# Patient Record
Sex: Female | Born: 1980 | Race: White | Hispanic: No | Marital: Married | Smoking: Never smoker
Health system: Southern US, Community
[De-identification: ages and names within clinical notes are randomized; demographics above are authoritative.]

## PROBLEM LIST (undated history)

## (undated) DIAGNOSIS — C801 Malignant (primary) neoplasm, unspecified: Secondary | ICD-10-CM

## (undated) DIAGNOSIS — T7840XA Allergy, unspecified, initial encounter: Secondary | ICD-10-CM

## (undated) DIAGNOSIS — M810 Age-related osteoporosis without current pathological fracture: Secondary | ICD-10-CM

## (undated) DIAGNOSIS — F419 Anxiety disorder, unspecified: Secondary | ICD-10-CM

## (undated) DIAGNOSIS — I10 Essential (primary) hypertension: Secondary | ICD-10-CM

## (undated) DIAGNOSIS — F32A Depression, unspecified: Secondary | ICD-10-CM

## (undated) HISTORY — DX: Essential (primary) hypertension: I10

## (undated) HISTORY — DX: Anxiety disorder, unspecified: F41.9

## (undated) HISTORY — DX: Age-related osteoporosis without current pathological fracture: M81.0

## (undated) HISTORY — DX: Malignant (primary) neoplasm, unspecified: C80.1

## (undated) HISTORY — DX: Allergy, unspecified, initial encounter: T78.40XA

## (undated) HISTORY — PX: EYE SURGERY: SHX253

## (undated) HISTORY — DX: Depression, unspecified: F32.A

## (undated) HISTORY — PX: ABDOMINAL HYSTERECTOMY: SHX81

## (undated) HISTORY — PX: BREAST SURGERY: SHX581

---

## 2012-07-20 DIAGNOSIS — J309 Allergic rhinitis, unspecified: Secondary | ICD-10-CM | POA: Insufficient documentation

## 2012-07-20 DIAGNOSIS — I10 Essential (primary) hypertension: Secondary | ICD-10-CM | POA: Insufficient documentation

## 2012-07-20 DIAGNOSIS — F329 Major depressive disorder, single episode, unspecified: Secondary | ICD-10-CM | POA: Insufficient documentation

## 2013-07-11 DIAGNOSIS — Z87898 Personal history of other specified conditions: Secondary | ICD-10-CM | POA: Insufficient documentation

## 2018-07-29 DIAGNOSIS — R55 Syncope and collapse: Secondary | ICD-10-CM | POA: Insufficient documentation

## 2018-07-29 DIAGNOSIS — R002 Palpitations: Secondary | ICD-10-CM | POA: Insufficient documentation

## 2018-07-29 HISTORY — DX: Syncope and collapse: R55

## 2020-08-21 ENCOUNTER — Ambulatory Visit: Payer: BC Managed Care – PPO | Admitting: Adult Health

## 2020-08-21 ENCOUNTER — Other Ambulatory Visit: Payer: Self-pay

## 2020-08-21 ENCOUNTER — Encounter: Payer: Self-pay | Admitting: Adult Health

## 2020-08-21 VITALS — BP 130/87 | HR 91 | Temp 98.6°F | Resp 16 | Ht 63.0 in | Wt 146.2 lb

## 2020-08-21 DIAGNOSIS — Z9221 Personal history of antineoplastic chemotherapy: Secondary | ICD-10-CM | POA: Insufficient documentation

## 2020-08-21 DIAGNOSIS — Z0000 Encounter for general adult medical examination without abnormal findings: Secondary | ICD-10-CM

## 2020-08-21 DIAGNOSIS — R1310 Dysphagia, unspecified: Secondary | ICD-10-CM | POA: Diagnosis not present

## 2020-08-21 DIAGNOSIS — Z923 Personal history of irradiation: Secondary | ICD-10-CM

## 2020-08-21 DIAGNOSIS — Z90710 Acquired absence of both cervix and uterus: Secondary | ICD-10-CM | POA: Diagnosis not present

## 2020-08-21 DIAGNOSIS — Z9013 Acquired absence of bilateral breasts and nipples: Secondary | ICD-10-CM | POA: Insufficient documentation

## 2020-08-21 DIAGNOSIS — E559 Vitamin D deficiency, unspecified: Secondary | ICD-10-CM | POA: Diagnosis not present

## 2020-08-21 DIAGNOSIS — Z23 Encounter for immunization: Secondary | ICD-10-CM

## 2020-08-21 DIAGNOSIS — M8580 Other specified disorders of bone density and structure, unspecified site: Secondary | ICD-10-CM

## 2020-08-21 DIAGNOSIS — Z853 Personal history of malignant neoplasm of breast: Secondary | ICD-10-CM | POA: Insufficient documentation

## 2020-08-21 DIAGNOSIS — E663 Overweight: Secondary | ICD-10-CM

## 2020-08-21 MED ORDER — LISINOPRIL 10 MG PO TABS: 10.0000 mg | ORAL_TABLET | Freq: Every day | ORAL | 1 refills | Status: DC

## 2020-08-21 MED ORDER — SERTRALINE HCL 100 MG PO TABS: 100.0000 mg | ORAL_TABLET | Freq: Every day | ORAL | 1 refills | Status: DC

## 2020-08-21 NOTE — Patient Instructions (Signed)
Dysphagia Eating Plan, Bite Size Food This diet plan is for people with moderate swallowing problems who have transitioned from pureed and minced foods. Bite size foods are soft and cut into small chunks so that they can be swallowed safely. On this eating plan, you may be instructed to drink liquids that are thickened. Work with your health care provider and your diet and nutrition specialist (dietitian) to make sure that you are following the diet safely and getting all the nutrients you need. What are tips for following this plan? General guidelines for foods   You may eat foods that are tender, soft, and moist.  Always test food texture before taking a bite. Poke food with a fork or spoon to make sure it is tender.  Food should be easy to cut and shew. Avoid large pieces of food that require a lot of chewing.  Take small bites. Each bite should be smaller than your thumb nail (about 20m by 15 mm).  If you were on pureed and minced food diet plans, you may eat any of the foods included in those diets.  Avoid foods that are very dry, hard, sticky, chewy, coarse, or crunchy.  If instructed by your health care provider, thicken liquids. Follow your health care provider's instructions for what products to use, how to do this, and to what thickness. ? Your health care provider may recommend using a commercial thickener, rice cereal, or potato flakes. Ask your health care provider to recommend thickeners. ? Thickened liquids are usually a "pudding-like" consistency, or they may be as thick as honey or thick enough to eat with a spoon. Cooking  To moisten foods, you may add liquids while you are blending, mashing, or grinding your foods to the right consistency. These liquids include gravies, sauces, vegetable or fruit juice, milk, half and half, or water.  Strain extra liquid from foods before eating.  Reheat foods slowly to prevent a tough crust from forming.  Prepare foods in  advance. Meal planning  Eat a variety of foods to get all the nutrients you need.  Some foods may be tolerated better than others. Work with your dietitian to identify which foods are safest for you to eat.  Follow your meal plan as told by your dietitian. What foods are allowed? Grains Moist breads without nuts or seeds. Biscuits, muffins, pancakes, and waffles that are well-moistened with syrup, jelly, margarine, or butter. Cooked cereals. Moist bread stuffing. Moist rice. Well-moistened cold cereal with small chunks. Well-cooked pasta, noodles, rice, and bread dressing in small pieces and thick sauce. Soft dumplings or spaetzle in small pieces and butter or gravy. Vegetables Soft, well-cooked vegetables in small pieces. Soft-cooked, mashed potatoes. Thickened vegetable juice. Fruits Canned or cooked fruits that are soft or moist and do not have skin or seeds. Fresh, soft bananas. Thickened fruit juices. Meat and other protein foods Tender, moist meats or poultry in small pieces. Moist meatballs or meatloaf. Fish without bones. Eggs or egg substitutes in small pieces. Tofu. Tempeh and meat alternatives in small pieces. Well-cooked, tender beans, peas, baked beans, and other legumes. Dairy Thickened milk. Cream cheese. Yogurt. Cottage cheese. Sour cream. Small pieces of soft cheese. Fats and oils Butter. Oils. Margarine. Mayonnaise. Gravy. Spreads. Sweets and desserts Soft, smooth, moist desserts. Pudding. Custard. Moist cakes. Jam. Jelly. Honey. Preserves. Ask your health care provider whether you can have frozen desserts. Seasoning and other foods All seasonings and sweeteners. All sauces with small chunks. Prepared tuna, egg, or chicken  salad without raw fruits or vegetables. Moist casseroles with small, tender pieces of meat. Soups with tender meat. What foods are not allowed? Grains Coarse or dry cereals. Dry breads. Toast. Crackers. Tough, crusty breads, such as [place] bread and  baguettes. Dry pancakes, waffles, and muffins. Sticky rice. Dry bread stuffing. Granola. Popcorn. Chips. Vegetables All raw vegetables. Cooked corn. Rubbery or stiff cooked vegetables. Stringy vegetables, such as celery. Tough, crisp fried potatoes. Potato skins. Fruits Hard, crunchy, stringy, high-pulp, and juicy raw fruits such as apples, pineapple, papaya, and watermelon. Small, round fruits, such as grapes. Dried fruit and fruit leather. Meat and other protein foods Large pieces of meat. Dry, tough meats, such as bacon, sausage, and hot dogs. Chicken, [place], or fish with skin and bones. Crunchy peanut butter. Nuts. Seeds. Nut and seed butters. Dairy Yogurt with nuts, seeds, or large chunks. Large chunks of cheese. Frozen desserts and milk consistency not allowed by your dietitian. Sweets and desserts Dry cakes. Chewy or dry cookies. Any desserts with nuts, seeds, dry fruits, coconut, pineapple, or anything dry, sticky, or hard. Chewy caramel. Licorice. Taffy-type candies. Ask your health care provider whether you can have frozen desserts. Seasoning and other foods Soups with tough or large chunks of meats, poultry, or vegetables. Corn or clam chowder. Smoothies with large chunks of fruit. Summary  Bite size foods can be helpful for people with moderate swallowing problems.  On the dysphagia eating plan, you may eat foods that are soft, moist, and cut into pieces smaller than 51mm by 41mm.  You may be instructed to thicken liquids. Follow your health care provider's instructions about how to do this and to what consistency. This information is not intended to replace advice given to you by your health care provider. Make sure you discuss any questions you have with your health care provider. Document Revised: 01/06/2019 Document Reviewed: 12/26/2016 Elsevier Patient Education  [place]. Dysphagia  Dysphagia is trouble swallowing. This condition occurs when solids and liquids  stick in a person's throat on the way down to the stomach, or when food takes longer to get to the stomach than usual. You may have problems swallowing food, liquids, or both. You may also have pain while trying to swallow. It may take you more time and effort to swallow something. What are the causes? This condition may be caused by:  Muscle problems. They may make it difficult for you to move food and liquids through the esophagus, which is the tube that connects your mouth to your stomach.  Blockages. You may have ulcers, scar tissue, or inflammation that blocks the normal passage of food and liquids. Causes of these problems include: ? Acid reflux from your stomach into your esophagus (gastroesophageal reflux). ? Infections. ? Radiation treatment for cancer. ? Medicines taken without enough fluids to wash them down into your stomach.  Stroke. This can affect the nerves and make it difficult to swallow.  Nerve problems. These prevent signals from being sent to the muscles of your esophagus to squeeze (contract) and move what you swallow down to your stomach.  Globus pharyngeus. This is a common problem that involves a feeling like something is stuck in your throat or a sense of trouble with swallowing, even though nothing is wrong with the swallowing passages.  Certain conditions, such as cerebral palsy or Parkinson's disease. What are the signs or symptoms? Common symptoms of this condition include:  A feeling that solids or liquids are stuck in your throat on  the way down to the stomach.  Pain while swallowing.  Coughing or gagging while trying to swallow. Other symptoms include:  Food moving back from your stomach to your mouth (regurgitation).  Noises coming from your throat.  Chest discomfort with swallowing.  A feeling of fullness when swallowing.  Drooling, especially when the throat is blocked.  Heartburn. How is this diagnosed? This condition may be diagnosed  by:  Barium X-ray. In this test, you will swallow a white liquid that sticks to the inside of your esophagus. X-ray images are then taken.  Endoscopy. In this test, a flexible telescope is inserted down your throat to look at your esophagus and your stomach.  CT scans and an MRI. How is this treated? Treatment for dysphagia depends on the cause of this condition, such as:  If the dysphagia is caused by acid reflux or infection, medicines may be used. They may include antibiotics and heartburn medicines.  If the dysphagia is caused by problems with the muscles, swallowing therapy may be used to help you strengthen your swallowing muscles. You may have to do specific exercises to strengthen the muscles or stretch them.  If the dysphagia is caused by a blockage or mass, procedures to remove the blockage may be done. You may need surgery and a feeding tube. You may need to make diet changes. Ask your health care provider for specific instructions. Follow these instructions at home: Medicines  Take over-the-counter and prescription medicines only as told by your health care provider.  If you were prescribed an antibiotic medicine, take it as told by your health care provider. Do not stop taking the antibiotic even if you start to feel better. Eating and drinking   Follow any diet changes as told by your health care provider.  Work with a diet and nutrition specialist (dietitian) to create an eating plan that will help you get the nutrients you need in order to stay healthy.  Eat soft foods that are easier to swallow.  Cut your food into small pieces and eat slowly. Take small bites.  Eat and drink only when you are sitting upright.  Do not drink alcohol or caffeine. If you need help quitting, ask your health care provider. General instructions  Check your weight every day to make sure you are not losing weight.  Do not use any products that contain nicotine or tobacco, such as  cigarettes, e-cigarettes, and chewing tobacco. If you need help quitting, ask your health care provider.  Keep all follow-up visits as told by your health care provider. This is important. Contact a health care provider if you:  Lose weight because you cannot swallow.  Cough when you drink liquids.  Cough up partially digested food. Get help right away if you:  Cannot swallow your saliva.  Have shortness of breath, a fever, or both.  Have a hoarse voice and also have trouble swallowing. Summary  Dysphagia is trouble swallowing. This condition occurs when solids and liquids stick in a person's throat on the way down to the stomach. You may cough or gag while trying to swallow.  Dysphagia has many possible causes.  Treatment for dysphagia depends on the cause of the condition.  Keep all follow-up visits as told by your health care provider. This is important. This information is not intended to replace advice given to you by your health care provider. Make sure you discuss any questions you have with your health care provider. Document Revised: 02/09/2019 Document Reviewed: 02/09/2019  Elsevier Patient Education  2020 [place] Maintenance, Female Adopting a healthy lifestyle and getting preventive care are important in promoting health and wellness. Ask your health care provider about:  The right schedule for you to have regular tests and exams.  Things you can do on your own to prevent diseases and keep yourself healthy. What should I know about diet, weight, and exercise? Eat a healthy diet   Eat a diet that includes plenty of vegetables, fruits, low-fat dairy products, and lean protein.  Do not eat a lot of foods that are high in solid fats, added sugars, or sodium. Maintain a healthy weight Body mass index (BMI) is used to identify weight problems. It estimates body fat based on height and weight. Your health care provider can help determine your BMI and help  you achieve or maintain a healthy weight. Get regular exercise Get regular exercise. This is one of the most important things you can do for your health. Most adults should:  Exercise for at least 150 minutes each week. The exercise should increase your heart rate and make you sweat (moderate-intensity exercise).  Do strengthening exercises at least twice a week. This is in addition to the moderate-intensity exercise.  Spend less time sitting. Even light physical activity can be beneficial. Watch cholesterol and blood lipids Have your blood tested for lipids and cholesterol at 41 years of age, then have this test every 5 years. Have your cholesterol levels checked more often if:  Your lipid or cholesterol levels are high.  You are older than 41 years of age.  You are at high risk for heart disease. What should I know about cancer screening? Depending on your health history and family history, you may need to have cancer screening at various ages. This may include screening for:  Breast cancer.  Cervical cancer.  Colorectal cancer.  Skin cancer.  Lung cancer. What should I know about heart disease, diabetes, and high blood pressure? Blood pressure and heart disease  High blood pressure causes heart disease and increases the risk of stroke. This is more likely to develop in people who have high blood pressure readings, are of African descent, or are overweight.  Have your blood pressure checked: ? Every 3-5 years if you are 60-88 years of age. ? Every year if you are 34 years old or older. Diabetes Have regular diabetes screenings. This checks your fasting blood sugar level. Have the screening done:  Once every three years after age 39 if you are at a normal weight and have a low risk for diabetes.  More often and at a younger age if you are overweight or have a high risk for diabetes. What should I know about preventing infection? Hepatitis B If you have a higher risk for  hepatitis B, you should be screened for this virus. Talk with your health care provider to find out if you are at risk for hepatitis B infection. Hepatitis C Testing is recommended for:  Everyone born from [birth year] through 1967.  Anyone with known risk factors for hepatitis C. Sexually transmitted infections (STIs)  Get screened for STIs, including gonorrhea and chlamydia, if: ? You are sexually active and are younger than 41 years of age. ? You are older than 41 years of age and your health care provider tells you that you are at risk for this type of infection. ? Your sexual activity has changed since you were last screened, and you are at increased risk for chlamydia  or gonorrhea. Ask your health care provider if you are at risk.  Ask your health care provider about whether you are at high risk for HIV. Your health care provider may recommend a prescription medicine to help prevent HIV infection. If you choose to take medicine to prevent HIV, you should first get tested for HIV. You should then be tested every 3 months for as long as you are taking the medicine. Pregnancy  If you are about to stop having your period (premenopausal) and you may become pregnant, seek counseling before you get pregnant.  Take 400 to 800 micrograms (mcg) of folic acid every day if you become pregnant.  Ask for birth control (contraception) if you want to prevent pregnancy. Osteoporosis and menopause Osteoporosis is a disease in which the bones lose minerals and strength with aging. This can result in bone fractures. If you are 101 years old or older, or if you are at risk for osteoporosis and fractures, ask your health care provider if you should:  Be screened for bone loss.  Take a calcium or vitamin D supplement to lower your risk of fractures.  Be given hormone replacement therapy (HRT) to treat symptoms of menopause. Follow these instructions at home: Lifestyle  Do not use any products that contain  nicotine or tobacco, such as cigarettes, e-cigarettes, and chewing tobacco. If you need help quitting, ask your health care provider.  Do not use street drugs.  Do not share needles.  Ask your health care provider for help if you need support or information about quitting drugs. Alcohol use  Do not drink alcohol if: ? Your health care provider tells you not to drink. ? You are pregnant, may be pregnant, or are planning to become pregnant.  If you drink alcohol: ? Limit how much you use to 0-1 drink a day. ? Limit intake if you are breastfeeding.  Be aware of how much alcohol is in your drink. In the [place] one drink equals one 12 oz bottle of beer (355 mL), one 5 oz glass of wine (148 mL), or one 1 oz glass of hard liquor (44 mL). General instructions  Schedule regular health, dental, and eye exams.  Stay current with your vaccines.  Tell your health care provider if: ? You often feel depressed. ? You have ever been abused or do not feel safe at home. Summary  Adopting a healthy lifestyle and getting preventive care are important in promoting health and wellness.  Follow your health care provider's instructions about healthy diet, exercising, and getting tested or screened for diseases.  Follow your health care provider's instructions on monitoring your cholesterol and blood pressure. This information is not intended to replace advice given to you by your health care provider. Make sure you discuss any questions you have with your health care provider. Document Revised: 09/08/2018 Document Reviewed: 09/08/2018 Elsevier Patient Education  2020 [employer].

## 2020-08-21 NOTE — Progress Notes (Signed)
New patient visit   Patient: Amy Johnston   DOB: 1981/04/15   39 y.o. Female  MRN: [id number] Visit Date: 08/21/2020  Today's healthcare provider: Marcille Buffy, FNP   Chief Complaint  Patient presents with   New Patient (Initial Visit)   Subjective    Amy Johnston is a 39 y.o. female who presents today as a new patient to establish care. She recently moved from [place].  HPI   She is looking for referral to gynecologist over due for PAP/ vaginal  smear. History of abnormal once in past.   She wants to see a gastroenterologist, she has dysphagia, she has a " hernia " and was told to see a gastroenterologist. She denies any choking or worsening symptoms, states she wants to see Gastric doctor after the new year. Denies any bleeding.   No LMP recorded. Patient has had a hysterectomy. Full hysterectomy - 2017.  Breast cancer bilateral mastectomy,2017 had chemo and radiation due to breast cancer.  No eveidence of disease her last check she reports and as a new patient ,she has appointment with breast cancer in [place].   Needs lisinopril and Sertraline refilled, she has been on for a long time she reports. Denies any concerns.  Denies any suicidal or homicidal ideations or intents.   She had seen ENT and they told her all was ok and to see gastroenterologist.   Patient  denies any fever, body aches,chills, rash, chest pain, shortness of breath, nausea, vomiting, or diarrhea.  Denies dizziness, lightheadedness, pre syncopal or syncopal episodes.   Past Medical History:  Diagnosis Date   Allergy    Anxiety    Cancer ([place])    Depression    Hypertension    Osteoporosis    Past Surgical History:  Procedure Laterality Date   ABDOMINAL HYSTERECTOMY     BREAST SURGERY     EYE SURGERY     Family Status  Relation Name Status   Mother  Alive   Father  Alive   Brother  Alive   Daughter  Alive   Daughter  Alive   Family History  Problem Relation Age of Onset     Healthy Mother    Healthy Father    Healthy Brother    Celiac disease Daughter    Social History   Socioeconomic History   Marital status: Married    Spouse name: Not on file   Number of children: Not on file   Years of education: Not on file   Highest education level: Not on file  Occupational History   Not on file  Tobacco Use   Smoking status: Never Smoker   Smokeless tobacco: Never Used  Scientific laboratory technician Use: Never used  Substance and Sexual Activity   Alcohol use: Never   Drug use: Never   Sexual activity: Not on file  Other Topics Concern   Not on file  Social History Narrative   Not on file   Social Determinants of Health   Financial Resource Strain:    Difficulty of Paying Living Expenses: Not on file  Food Insecurity:    Worried About Charity fundraiser in the Last Year: Not on file   [employer] of Food in the Last Year: Not on file  Transportation Needs:    Lack of Transportation (Medical): Not on file   Lack of Transportation (Non-Medical): Not on file  Physical Activity:    Days of Exercise per Week: Not on  file   Minutes of Exercise per Session: Not on file  Stress:    Feeling of Stress : Not on file  Social Connections:    Frequency of Communication with Friends and Family: Not on file   Frequency of Social Gatherings with Friends and Family: Not on file   Attends Religious Services: Not on file   Active Member of Clubs or Organizations: Not on file   Attends Archivist Meetings: Not on file   Marital Status: Not on file   Outpatient Medications Prior to Visit  Medication Sig   letrozole ([place]) 2.5 MG tablet Take by mouth.   [DISCONTINUED] lisinopril (ZESTRIL) 10 MG tablet Take by mouth.   [DISCONTINUED] sertraline (ZOLOFT) 50 MG tablet sertraline 50 mg tablet   No facility-administered medications prior to visit.   Allergies  Allergen Reactions   Codeine     N/V N/V N/V N/V      Immunization History  Administered Date(s) Administered   Influenza Inj Mdck Quad Pf 08/14/2020   Influenza Split 07/16/2012   Influenza,inj,quad, With Preservative 06/10/2018   Moderna SARS-COVID-2 Vaccination 08/16/2020   PFIZER SARS-COV-2 Vaccination 12/04/2019, 12/25/2019    Health Maintenance  Topic Date Due   PAP SMEAR-Modifier  08/21/2020 (Originally 12/19/1999)   TETANUS/TDAP  09/26/2020 (Originally 12/18/1997)   Hepatitis C Screening  08/21/2021 (Originally [birth year])   HIV Screening  08/21/2021 (Originally 12/18/1993)   INFLUENZA VACCINE  Completed   COVID-19 Vaccine  Completed    Patient Care Team: Chelise Hanger, Kelby Aline, FNP as PCP - General (Family Medicine)  Review of Systems  Constitutional: Negative.   HENT: Negative.   Eyes: Negative.   Respiratory: Negative.   Cardiovascular: Negative.   Gastrointestinal: Negative.   Endocrine: Negative.   Genitourinary: Negative.   Musculoskeletal: Negative.   Skin: Negative.   Allergic/Immunologic: Negative.   Neurological: Negative.   Hematological: Negative.   Psychiatric/Behavioral: Negative.     Last CBC No results found for: WBC, HGB, HCT, MCV, MCH, RDW, PLT Last metabolic panel No results found for: GLUCOSE, NA, K, CL, CO2, BUN, CREATININE, GFRNONAA, GFRAA, CALCIUM, PHOS, PROT, ALBUMIN, LABGLOB, AGRATIO, BILITOT, ALKPHOS, AST, ALT, ANIONGAP    Objective    Temp 98.6 F (37 C) (Oral)    Resp 16    Ht 5\' 3"  (1.6 m)    Wt 146 lb 3.2 oz (66.3 kg)    BMI 25.90 kg/m  Physical Exam Vitals reviewed.  Constitutional:      General: She is not in acute distress.    Appearance: She is well-developed. She is not diaphoretic.     Interventions: She is not intubated. HENT:     Head: Normocephalic and atraumatic.     Right Ear: Tympanic membrane, ear canal and external ear normal. There is no impacted cerumen.     Left Ear: Tympanic membrane, ear canal and external ear normal. There is no impacted  cerumen.     Nose: Nose normal. No congestion or rhinorrhea.     Mouth/Throat:     Mouth: Mucous membranes are moist.     Pharynx: No oropharyngeal exudate or posterior oropharyngeal erythema.  Eyes:     General: Lids are normal. No scleral icterus.       Right eye: No discharge.        Left eye: No discharge.     Conjunctiva/sclera: Conjunctivae normal.     Right eye: Right conjunctiva is not injected. No exudate or hemorrhage.    Left eye: Left conjunctiva  is not injected. No exudate or hemorrhage.    Pupils: Pupils are equal, round, and reactive to light.  Neck:     Thyroid: No thyroid mass or thyromegaly.     Vascular: Normal carotid pulses. No carotid bruit, hepatojugular reflux or JVD.     Trachea: Trachea and phonation normal. No tracheal tenderness or tracheal deviation.     Meningeal: Brudzinski's sign and Kernig's sign absent.  Cardiovascular:     Rate and Rhythm: Normal rate and regular rhythm.     Pulses: Normal pulses.          Radial pulses are 2+ on the right side and 2+ on the left side.       Dorsalis pedis pulses are 2+ on the right side and 2+ on the left side.       Posterior tibial pulses are 2+ on the right side and 2+ on the left side.     Heart sounds: Normal heart sounds, S1 normal and S2 normal. Heart sounds not distant. No murmur heard.  No friction rub. No gallop.   Pulmonary:     Effort: Pulmonary effort is normal. No tachypnea, bradypnea, accessory muscle usage or respiratory distress. She is not intubated.     Breath sounds: Normal breath sounds. No stridor. No wheezing, rhonchi or rales.  Chest:     Chest wall: No tenderness.     Comments: Deferred, denies any changes and she has follow up with oncology at [facility].  Abdominal:     General: Bowel sounds are normal. There is no distension or abdominal bruit.     Palpations: Abdomen is soft. There is no shifting dullness, fluid wave, hepatomegaly, splenomegaly, mass or pulsatile mass.     Tenderness: There  is no abdominal tenderness. There is no right CVA tenderness, left CVA tenderness, guarding or rebound.     Hernia: No hernia is present.  Genitourinary:    Comments: Hysterectomy, history she does still want gynecologist. deferred exam.  Musculoskeletal:        General: No tenderness or deformity. Normal range of motion.     Cervical back: Full passive range of motion without pain, normal range of motion and neck supple. No edema, erythema, rigidity or tenderness. No spinous process tenderness or muscular tenderness. Normal range of motion.  Lymphadenopathy:     Head:     Right side of head: No submental, submandibular, tonsillar, preauricular, posterior auricular or occipital adenopathy.     Left side of head: No submental, submandibular, tonsillar, preauricular, posterior auricular or occipital adenopathy.     Cervical: No cervical adenopathy.     Right cervical: No superficial, deep or posterior cervical adenopathy.    Left cervical: No superficial, deep or posterior cervical adenopathy.     Upper Body:     Right upper body: No supraclavicular or pectoral adenopathy.     Left upper body: No supraclavicular or pectoral adenopathy.  Skin:    General: Skin is warm and dry.     Coloration: Skin is not pale.     Findings: No abrasion, bruising, burn, ecchymosis, erythema, lesion, petechiae or rash.     Nails: There is no clubbing.  Neurological:     Mental Status: She is alert and oriented to person, place, and time.     GCS: GCS eye subscore is 4. GCS verbal subscore is 5. GCS motor subscore is 6.     Cranial Nerves: No cranial nerve deficit.     Sensory: No sensory  deficit.     Motor: No tremor, atrophy, abnormal muscle tone or seizure activity.     Coordination: Coordination normal.     Gait: Gait normal.     Deep Tendon Reflexes: Reflexes are normal and symmetric. Reflexes normal. Babinski sign absent on the right side. Babinski sign absent on the left side.     Reflex Scores:       Tricep reflexes are 2+ on the right side and 2+ on the left side.      Bicep reflexes are 2+ on the right side and 2+ on the left side.      Brachioradialis reflexes are 2+ on the right side and 2+ on the left side.      Patellar reflexes are 2+ on the right side and 2+ on the left side.      Achilles reflexes are 2+ on the right side and 2+ on the left side. Psychiatric:        Speech: Speech normal.        Behavior: Behavior normal.        Thought Content: Thought content normal.        Judgment: Judgment normal.     Depression Screen PHQ 2/9 Scores 08/21/2020  PHQ - 2 Score 1  PHQ- 9 Score 7   No results found for any visits on 08/21/20.  Assessment & Plan     Routine health maintenance  H/O total vaginal hysterectomy - Plan: Ambulatory referral to Gynecology  Dysphagia, unspecified type - Plan: Ambulatory referral to Gastroenterology  Vitamin D deficiency - Plan: VITAMIN D 25 Hydroxy (Vit-D Deficiency, Fractures)  Need for Tdap vaccination  Osteopenia, unspecified location - Plan: CBC with Differential/Platelet, Comprehensive Metabolic Panel (CMET), TSH, Lipid Panel w/o Chol/HDL Ratio  History of bilateral mastectomy  History of breast cancer  History of radiation therapy  History of cancer chemotherapy  Overweight with body mass index (BMI) 25.0-29.9  Meds ordered this encounter  Medications   lisinopril (ZESTRIL) 10 MG tablet    Sig: Take 1 tablet (10 mg total) by mouth daily.    Dispense:  90 tablet    Refill:  1   sertraline (ZOLOFT) 100 MG tablet    Sig: Take 1 tablet (100 mg total) by mouth daily.    Dispense:  90 tablet    Refill:  1    Discussed known black box warning for anti depression/ anxiety medication. Need to report any behavioral changes right, if any homicidal or suicidal thoughts or ideas seek medical attention right away. Call 911.  She may want therapist in future ok to call for referral if no change, she declines today.   Orders  Placed This Encounter  Procedures   CBC with Differential/Platelet   Comprehensive Metabolic Panel (CMET)   TSH   Lipid Panel w/o Chol/HDL Ratio   VITAMIN D 25 Hydroxy (Vit-D Deficiency, Fractures)   Ambulatory referral to Gynecology    Referral Priority:   Urgent    Referral Type:   Consultation    Referral Reason:   Specialty Services Required    Requested Specialty:   Gynecology    Number of Visits Requested:   1   Ambulatory referral to Gastroenterology    Referral Priority:   Routine    Referral Type:   Consultation    Referral Reason:   Specialty Services Required    Referred to Provider:   Lucilla Lame, MD    Number of Visits Requested:   1  Red Flags discussed. The patient was given clear instructions to go to ER or return to medical center if any red flags develop, symptoms do not improve, worsen or new problems develop. They verbalized understanding. Keep specialty appointments.   The patient is advised to begin progressive daily aerobic exercise program, follow a low fat, low cholesterol diet, attempt to lose weight, continue current healthy lifestyle patterns and return for routine annual checkups.   Return in about 3 months (around 11/21/2020), or if symptoms worsen or fail to improve, for at any time for any worsening symptoms, Go to Emergency room/ urgent care if worse.      Marcille Buffy, [place] [phone] (phone) [phone] (fax)  [place]

## 2020-08-30 DIAGNOSIS — Z9189 Other specified personal risk factors, not elsewhere classified: Secondary | ICD-10-CM | POA: Diagnosis not present

## 2020-08-30 DIAGNOSIS — Z170 Estrogen receptor positive status [ER+]: Secondary | ICD-10-CM | POA: Diagnosis not present

## 2020-08-30 DIAGNOSIS — Z79811 Long term (current) use of aromatase inhibitors: Secondary | ICD-10-CM | POA: Diagnosis not present

## 2020-08-30 DIAGNOSIS — R109 Unspecified abdominal pain: Secondary | ICD-10-CM | POA: Diagnosis not present

## 2020-08-30 DIAGNOSIS — C50412 Malignant neoplasm of upper-outer quadrant of left female breast: Secondary | ICD-10-CM | POA: Diagnosis not present

## 2020-09-20 DIAGNOSIS — Z853 Personal history of malignant neoplasm of breast: Secondary | ICD-10-CM | POA: Diagnosis not present

## 2020-09-20 DIAGNOSIS — Z9013 Acquired absence of bilateral breasts and nipples: Secondary | ICD-10-CM | POA: Diagnosis not present

## 2020-09-20 DIAGNOSIS — M79622 Pain in left upper arm: Secondary | ICD-10-CM | POA: Diagnosis not present

## 2020-09-20 DIAGNOSIS — N644 Mastodynia: Secondary | ICD-10-CM | POA: Diagnosis not present

## 2020-10-03 ENCOUNTER — Other Ambulatory Visit: Payer: Self-pay | Admitting: Adult Health

## 2020-10-03 ENCOUNTER — Encounter: Payer: Self-pay | Admitting: Adult Health

## 2020-10-03 ENCOUNTER — Ambulatory Visit
Admission: RE | Admit: 2020-10-03 | Discharge: 2020-10-03 | Disposition: A | Discharge status: Home / Self Care | Payer: BC Managed Care – PPO | Source: Ambulatory Visit | Attending: Adult Health | Admitting: Adult Health

## 2020-10-03 ENCOUNTER — Other Ambulatory Visit: Payer: Self-pay

## 2020-10-03 ENCOUNTER — Ambulatory Visit
Admission: RE | Admit: 2020-10-03 | Discharge: 2020-10-03 | Disposition: A | Discharge status: Home / Self Care | Payer: BC Managed Care – PPO | Source: Home / Self Care | Attending: Adult Health | Admitting: Adult Health

## 2020-10-03 ENCOUNTER — Ambulatory Visit: Payer: BC Managed Care – PPO | Admitting: Adult Health

## 2020-10-03 VITALS — BP 131/86 | HR 76 | Temp 98.2°F | Wt 150.0 lb

## 2020-10-03 DIAGNOSIS — Z90710 Acquired absence of both cervix and uterus: Secondary | ICD-10-CM

## 2020-10-03 DIAGNOSIS — R319 Hematuria, unspecified: Secondary | ICD-10-CM | POA: Insufficient documentation

## 2020-10-03 DIAGNOSIS — Z9221 Personal history of antineoplastic chemotherapy: Secondary | ICD-10-CM

## 2020-10-03 DIAGNOSIS — Z853 Personal history of malignant neoplasm of breast: Secondary | ICD-10-CM | POA: Insufficient documentation

## 2020-10-03 DIAGNOSIS — Z9013 Acquired absence of bilateral breasts and nipples: Secondary | ICD-10-CM

## 2020-10-03 DIAGNOSIS — Z923 Personal history of irradiation: Secondary | ICD-10-CM

## 2020-10-03 DIAGNOSIS — M545 Low back pain, unspecified: Secondary | ICD-10-CM

## 2020-10-03 DIAGNOSIS — M5137 Other intervertebral disc degeneration, lumbosacral region: Secondary | ICD-10-CM | POA: Diagnosis not present

## 2020-10-03 DIAGNOSIS — Z87442 Personal history of urinary calculi: Secondary | ICD-10-CM | POA: Insufficient documentation

## 2020-10-03 DIAGNOSIS — R109 Unspecified abdominal pain: Secondary | ICD-10-CM | POA: Diagnosis not present

## 2020-10-03 LAB — POCT URINALYSIS DIPSTICK
Bilirubin, UA: NEGATIVE
Glucose, UA: NEGATIVE
Ketones, UA: NEGATIVE
Leukocytes, UA: NEGATIVE
Nitrite, UA: NEGATIVE
Protein, UA: NEGATIVE
Spec Grav, UA: 1.015 (ref 1.010–1.025)
Urobilinogen, UA: 0.2 E.U./dL
pH, UA: 6 (ref 5.0–8.0)

## 2020-10-03 NOTE — Progress Notes (Signed)
CT scan is unremarkable.- no stone seen, no abnormality noted. Given her medical history will order chest x ray, lumbar spine and thoracic spine she can walk in at outpatient imaging - orders are in. Will wait for results, could also be muscular, lets get x rays to be sure and then keep follow up.

## 2020-10-03 NOTE — Progress Notes (Addendum)
Acute Office Visit  Subjective:    Patient ID: Amy Johnston, female    DOB: September 07, 1981, 40 y.o.   MRN: [id number]  No chief complaint on file.   HPI Patient is in today for back pain that has been present for 6-8 weeks.  Patient denies urinary symptoms. However she does have history of kidney stone.  She has had no visible hematuria and denies pain with urinating. She denies any radiation of pain.  No aggravating or alleviating factors.  She was seen by oncology on12/23/21 and during exam her back had tenderness when being tapped by physician.    She does not have any urinary symptoms.  She has left sided flank pain.  She has no known trauma to her back.  History of breast cancer treated with mastectomy, chemotherapy and radiation.  Denies any hematuria.  She see's [facility].   denies any melena, change in bowel habits or mucous in stools.   Patient  denies any fever, chills, rash, chest pain, shortness of breath, nausea, vomiting, or diarrhea.    Denies dizziness, lightheadedness, pre syncopal or syncopal episodes.    Past Medical History:  Diagnosis Date  . Allergy   . Anxiety   . Cancer ([place])   . Depression   . Hypertension   . Osteoporosis     Past Surgical History:  Procedure Laterality Date  . ABDOMINAL HYSTERECTOMY    . BREAST SURGERY    . EYE SURGERY      Family History  Problem Relation Age of Onset  . Healthy Mother   . Healthy Father   . Healthy Brother   . Celiac disease Daughter     Social History   Socioeconomic History  . Marital status: Married    Spouse name: Not on file  . Number of children: Not on file  . Years of education: Not on file  . Highest education level: Not on file  Occupational History  . Not on file  Tobacco Use  . Smoking status: Never Smoker  . Smokeless tobacco: Never Used  Vaping Use  . Vaping Use: Never used  Substance and Sexual Activity  . Alcohol use: Never  . Drug use: Never  . Sexual activity:  Not on file  Other Topics Concern  . Not on file  Social History Narrative  . Not on file   Social Determinants of Health   Financial Resource Strain: Not on file  Food Insecurity: Not on file  Transportation Needs: Not on file  Physical Activity: Not on file  Stress: Not on file  Social Connections: Not on file  Intimate Partner Violence: Not on file    Outpatient Medications Prior to Visit  Medication Sig Dispense Refill  . letrozole (FEMARA) 2.5 MG tablet Take by mouth.    Marland Kitchen lisinopril (ZESTRIL) 10 MG tablet Take 1 tablet (10 mg total) by mouth daily. 90 tablet 1  . sertraline (ZOLOFT) 100 MG tablet Take 1 tablet (100 mg total) by mouth daily. 90 tablet 1   No facility-administered medications prior to visit.    Allergies  Allergen Reactions  . Codeine     N/V N/V N/V N/V     Review of Systems  Constitutional: Negative.  Negative for fever.  Eyes: Negative.   Respiratory: Negative.   Cardiovascular: Negative.   Gastrointestinal: Negative.   Genitourinary: Negative.  Negative for difficulty urinating, dysuria, frequency and hematuria.  Musculoskeletal: Positive for back pain. Negative for arthralgias, gait problem, joint swelling,  myalgias and neck pain.  Skin: Negative.  Negative for rash.  Neurological: Negative.   Hematological: Negative.   Psychiatric/Behavioral: Negative.        Objective:    Physical Exam Vitals reviewed.  Constitutional:      General: She is not in acute distress.    Appearance: Normal appearance. She is not ill-appearing, toxic-appearing or diaphoretic.  HENT:     Head: Normocephalic and atraumatic.     Right Ear: Tympanic membrane normal.     Left Ear: Tympanic membrane normal.     Nose: Nose normal.     Mouth/Throat:     Mouth: Mucous membranes are moist.  Eyes:     General: No scleral icterus.    Conjunctiva/sclera: Conjunctivae normal.     Pupils: Pupils are equal, round, and reactive to light.  Cardiovascular:      Rate and Rhythm: Normal rate and regular rhythm.     Pulses: Normal pulses.     Heart sounds: Normal heart sounds.  Abdominal:     General: There is no distension.     Palpations: Abdomen is soft. There is no mass.     Tenderness: There is no abdominal tenderness. There is left CVA tenderness. There is no right CVA tenderness, guarding or rebound.     Hernia: No hernia is present.  Musculoskeletal:        General: Normal range of motion.     Cervical back: Normal range of motion and neck supple.     Right lower leg: No edema.     Left lower leg: No edema.  Skin:    General: Skin is warm.     Findings: No rash (no zoster ).  Neurological:     General: No focal deficit present.     Mental Status: She is alert and oriented to person, place, and time.     Motor: No weakness.     Gait: Gait normal.  Psychiatric:        Mood and Affect: Mood normal.        Behavior: Behavior normal.        Thought Content: Thought content normal.        Judgment: Judgment normal.     BP 131/86 (BP Location: Right Wrist, Patient Position: Sitting, Cuff Size: Normal)   Pulse 76   Temp 98.2 F (36.8 C) (Oral)   Wt 150 lb (68 kg)   SpO2 100%   BMI 26.57 kg/m  Wt Readings from Last 3 Encounters:  10/03/20 150 lb (68 kg)  08/21/20 146 lb 3.2 oz (66.3 kg)    Health Maintenance Due  Topic Date Due  . TETANUS/TDAP  Never done  . PAP SMEAR-Modifier  Never done    There are no preventive care reminders to display for this patient.   No results found for: TSH No results found for: WBC, HGB, HCT, MCV, PLT No results found for: NA, K, CHLORIDE, CO2, GLUCOSE, BUN, CREATININE, BILITOT, ALKPHOS, AST, ALT, PROT, ALBUMIN, CALCIUM, ANIONGAP, EGFR, GFR No results found for: CHOL No results found for: HDL No results found for: LDLCALC No results found for: TRIG No results found for: CHOLHDL No results found for: HGBA1C     Assessment & Plan:   Problem List Items Addressed This Visit      Other    History of breast cancer   Relevant Orders   CT RENAL STONE STUDY (Completed)   H/O total vaginal hysterectomy   Relevant Orders  CT RENAL STONE STUDY (Completed)   History of bilateral mastectomy   Relevant Orders   CT RENAL STONE STUDY (Completed)   History of radiation therapy   Relevant Orders   CT RENAL STONE STUDY (Completed)   History of cancer chemotherapy   Relevant Orders   CT RENAL STONE STUDY (Completed)   Low back pain - Primary   Relevant Orders   POCT urinalysis dipstick (Completed)   Urine Culture   DG Abd 1 View (Completed)   CT RENAL STONE STUDY (Completed)    Other Visit Diagnoses    History of kidney stones       Relevant Orders   CT RENAL STONE STUDY (Completed)   Hematuria, unspecified type- on POCT 10/04/19 none visualized in urine or by patient.        Relevant Orders   CT RENAL STONE STUDY (Completed)     She has had labs at [facility], did not go for labs this provider originally ordered, reviewed [facility] do not  See TSH , she can do this lab today. Will also get CBC / CMP/   Suspect kidney stone however given duration of 6 to 8 week and history of malignancy further work up needed.   Orders Placed This Encounter  Procedures  . Urine Culture  . DG Abd 1 View    Order Specific Question:   Reason for Exam (SYMPTOM  OR DIAGNOSIS REQUIRED)    Answer:   left sided flank pain x 6 weeks history kidney stones    Order Specific Question:   Is patient pregnant?    Answer:   No    Order Specific Question:   Preferred imaging location?    Answer:   [facility]  . CT RENAL STONE STUDY    Hold pt until call report    Order Specific Question:   Is patient pregnant?    Answer:   No    Order Specific Question:   Preferred imaging location?    Answer:   [facility]    Order Specific Question:   Call Results- Best Contact Number?    Answer:   [phone]  . POCT urinalysis dipstick   CBC,CMP, TSH.   Taking over the counter Ibuprofen with relief.    No orders of the defined types were placed in this encounter.  Return in about 1 week (around 10/10/2020), or if symptoms worsen or fail to improve, for at any time for any worsening symptoms, Go to Emergency room/ urgent care if worse.   The entirety of the information documented in the History of Present Illness, Review of Systems and Physical Exam were personally obtained by me. Portions of this information were initially documented by the CMA and reviewed by me for thoroughness and accuracy.    Marcille Buffy, FNP

## 2020-10-03 NOTE — Progress Notes (Signed)
Culture ordered

## 2020-10-03 NOTE — Progress Notes (Signed)
KUB shows no definitive stone, she does have increased gas and also some constipation. Increase fiber in diet, try metamucil per package instructions, Gas - X  and colace per package ok as well.  Referrals is working on getting her CT renal stone approved by insurance - seek care immediately if worsens at anytime.

## 2020-10-03 NOTE — Patient Instructions (Signed)
Flank Pain, Adult Flank pain is pain in your side. The flank is the area of your side between your upper belly (abdomen) and your back. The pain may occur over a short time (acute), or it may be long-term or come back often (chronic). It may be mild or very bad. Pain in this area can be caused by many different things. Follow these instructions at home:   Drink enough fluid to keep your pee (urine) clear or pale yellow.  Rest as told by your doctor.  Take over-the-counter and prescription medicines only as told by your doctor.  Keep a journal to keep track of: ? What has caused your flank pain. ? What has made it feel better.  Keep all follow-up visits as told by your doctor. This is important. Contact a doctor if:  Medicine does not help your pain.  You have new symptoms.  Your pain gets worse.  You have a fever.  Your symptoms last longer than 2-3 days.  You have trouble peeing.  You are peeing more often than normal. Get help right away if:  You have trouble breathing.  You are short of breath.  Your belly hurts, or it is swollen or red.  You feel sick to your stomach (nauseous).  You throw up (vomit).  You feel like you will pass out, or you do pass out (faint).  You have blood in your pee. Summary  Flank pain is pain in your side. The flank is the area of your side between your upper belly (abdomen) and your back.  Flank pain may occur over a short time (acute), or it may be long-term or come back often (chronic). It may be mild or very bad.  Pain in this area can be caused by many different things.  Contact your doctor if your symptoms get worse or they last longer than 2-3 days. This information is not intended to replace advice given to you by your health care provider. Make sure you discuss any questions you have with your health care provider. Document Revised: 08/28/2017 Document Reviewed: 01/05/2017 Elsevier Patient Education  2020 Elsevier  Inc.  

## 2020-10-03 NOTE — Progress Notes (Signed)
Orders Placed This Encounter  Procedures  . DG Lumbar Spine Complete  . DG Thoracic Spine 2 View  . DG Chest 2 View

## 2020-10-05 LAB — URINE CULTURE: Organism ID, Bacteria: NO GROWTH

## 2020-10-05 LAB — SPECIMEN STATUS REPORT

## 2020-10-17 ENCOUNTER — Ambulatory Visit (INDEPENDENT_AMBULATORY_CARE_PROVIDER_SITE_OTHER): Payer: BC Managed Care – PPO | Admitting: Adult Health

## 2020-10-17 DIAGNOSIS — Z5329 Procedure and treatment not carried out because of patient's decision for other reasons: Secondary | ICD-10-CM

## 2020-10-17 NOTE — Progress Notes (Signed)
No show

## 2020-10-29 DIAGNOSIS — C50412 Malignant neoplasm of upper-outer quadrant of left female breast: Secondary | ICD-10-CM | POA: Diagnosis not present

## 2020-10-29 DIAGNOSIS — Z170 Estrogen receptor positive status [ER+]: Secondary | ICD-10-CM | POA: Diagnosis not present

## 2020-10-29 DIAGNOSIS — Z9189 Other specified personal risk factors, not elsewhere classified: Secondary | ICD-10-CM | POA: Diagnosis not present

## 2020-12-26 DIAGNOSIS — L821 Other seborrheic keratosis: Secondary | ICD-10-CM | POA: Diagnosis not present

## 2020-12-26 DIAGNOSIS — D225 Melanocytic nevi of trunk: Secondary | ICD-10-CM | POA: Diagnosis not present

## 2020-12-26 DIAGNOSIS — L814 Other melanin hyperpigmentation: Secondary | ICD-10-CM | POA: Diagnosis not present

## 2020-12-26 DIAGNOSIS — D1801 Hemangioma of skin and subcutaneous tissue: Secondary | ICD-10-CM | POA: Diagnosis not present

## 2021-01-09 ENCOUNTER — Telehealth: Payer: Self-pay

## 2021-01-09 NOTE — Telephone Encounter (Signed)
Copied from [place] [phone]. Topic: General - Call Back - No Documentation >> Jan 09, 2021  4:25 PM Erick Blinks wrote: Best contact: [phone] Pt would like to be scheduled for a covid test, she tested positive for her at home covid test. Wants to double check with office

## 2021-01-10 NOTE — Telephone Encounter (Signed)
Patient has been advised. KW 

## 2021-02-20 DIAGNOSIS — J302 Other seasonal allergic rhinitis: Secondary | ICD-10-CM | POA: Diagnosis not present

## 2021-02-20 DIAGNOSIS — J3489 Other specified disorders of nose and nasal sinuses: Secondary | ICD-10-CM | POA: Diagnosis not present

## 2021-03-07 DIAGNOSIS — Z87891 Personal history of nicotine dependence: Secondary | ICD-10-CM | POA: Diagnosis not present

## 2021-03-07 DIAGNOSIS — C50412 Malignant neoplasm of upper-outer quadrant of left female breast: Secondary | ICD-10-CM | POA: Diagnosis not present

## 2021-03-07 DIAGNOSIS — Z9013 Acquired absence of bilateral breasts and nipples: Secondary | ICD-10-CM | POA: Diagnosis not present

## 2021-03-07 DIAGNOSIS — Z79899 Other long term (current) drug therapy: Secondary | ICD-10-CM | POA: Diagnosis not present

## 2021-03-07 DIAGNOSIS — Z170 Estrogen receptor positive status [ER+]: Secondary | ICD-10-CM | POA: Diagnosis not present

## 2021-03-22 ENCOUNTER — Other Ambulatory Visit: Payer: Self-pay | Admitting: Adult Health

## 2021-03-22 MED ORDER — LISINOPRIL 10 MG PO TABS: 10.0000 mg | ORAL_TABLET | Freq: Every day | ORAL | 1 refills | Status: DC

## 2021-03-22 NOTE — Telephone Encounter (Signed)
Patient was last seen 01/05.

## 2021-03-22 NOTE — Telephone Encounter (Signed)
Medication Refill - Medication: lisinopril 10 mg  Has the patient contacted their pharmacy? Yes.  . Pt said the pharm said they will contact us. Pt is going out of town today and only has 2 pills left. Pt also would like a nurse to return her call  Preferred Pharmacy (with phone number or street name): [facility] [address] [place] phone number [phone].   Agent: Please be advised that RX refills may take up to 3 business days. We ask that you follow-up with your pharmacy.

## 2021-03-22 NOTE — Telephone Encounter (Signed)
   Notes to clinic: review for refill Patient only has 2 pills Last seen by Laverna Peace     Requested Prescriptions  Pending Prescriptions Disp Refills   lisinopril (ZESTRIL) 10 MG tablet 90 tablet 1    Sig: Take 1 tablet (10 mg total) by mouth daily.      Cardiovascular:  ACE Inhibitors Failed - 03/22/2021 10:15 AM      Failed - Cr in normal range and within 180 days    No results found for: CREATININE, LABCREAU, LABCREA, POCCRE        Failed - K in normal range and within 180 days    No results found for: K, POTASSIUM, POCK        Passed - Patient is not pregnant      Passed - Last BP in normal range    BP Readings from Last 1 Encounters:  10/03/20 131/86          Passed - Valid encounter within last 6 months    Recent Outpatient Visits           5 months ago No-show for appointment   [facility] Flinchum, Kelby Aline, FNP   5 months ago Low back pain, unspecified back pain laterality, unspecified chronicity, unspecified whether sciatica present   [place], FNP   7 months ago Routine health maintenance   [facility] Flinchum, Kelby Aline, [place]

## 2021-04-29 ENCOUNTER — Telehealth: Payer: Self-pay | Admitting: Adult Health

## 2021-04-29 MED ORDER — SERTRALINE HCL 100 MG PO TABS: 100.0000 mg | ORAL_TABLET | Freq: Every day | ORAL | 0 refills | Status: DC

## 2021-04-29 NOTE — Telephone Encounter (Signed)
[facility] faxed refill request for the following medications:   sertraline (ZOLOFT) 100 MG tablet    Please advise.

## 2021-05-24 ENCOUNTER — Other Ambulatory Visit: Payer: Self-pay | Admitting: Family Medicine

## 2021-05-24 NOTE — Telephone Encounter (Signed)
Requested medication (s) are due for refill today: No  Requested medication (s) are on the active medication list: Yes  Last refill:  04/29/21  Future visit scheduled: No  Notes to clinic:  Pharmacy requesting 90 day supply.    Requested Prescriptions  Pending Prescriptions Disp Refills   sertraline (ZOLOFT) 100 MG tablet [Pharmacy Med Name: SERTRALINE HCL 100 MG TABLET] 90 tablet 1    Sig: Take 1 tablet (100 mg total) by mouth daily. Please schedule an office visit before anymore refills.     Psychiatry:  Antidepressants - SSRI Failed - 05/24/2021  9:30 AM      Failed - Valid encounter within last 6 months    Recent Outpatient Visits           7 months ago No-show for appointment   [facility]um, Kelby Aline, FNP   7 months ago Low back pain, unspecified back pain laterality, unspecified chronicity, unspecified whether sciatica present   [employer], FNP   9 months ago Routine health maintenance   [place], FNP              Passed - Completed PHQ-2 or PHQ-9 in the last 360 days

## 2021-06-11 DIAGNOSIS — M6289 Other specified disorders of muscle: Secondary | ICD-10-CM | POA: Diagnosis not present

## 2021-06-11 DIAGNOSIS — Z01419 Encounter for gynecological examination (general) (routine) without abnormal findings: Secondary | ICD-10-CM | POA: Diagnosis not present

## 2021-06-11 DIAGNOSIS — Z853 Personal history of malignant neoplasm of breast: Secondary | ICD-10-CM | POA: Diagnosis not present

## 2021-10-07 DIAGNOSIS — Z170 Estrogen receptor positive status [ER+]: Secondary | ICD-10-CM | POA: Diagnosis not present

## 2021-10-07 DIAGNOSIS — T17310S Gastric contents in larynx causing asphyxiation, sequela: Secondary | ICD-10-CM | POA: Diagnosis not present

## 2021-10-07 DIAGNOSIS — R1310 Dysphagia, unspecified: Secondary | ICD-10-CM | POA: Diagnosis not present

## 2021-10-07 DIAGNOSIS — C50412 Malignant neoplasm of upper-outer quadrant of left female breast: Secondary | ICD-10-CM | POA: Diagnosis not present

## 2021-10-07 DIAGNOSIS — R109 Unspecified abdominal pain: Secondary | ICD-10-CM | POA: Diagnosis not present

## 2021-10-07 DIAGNOSIS — R6882 Decreased libido: Secondary | ICD-10-CM | POA: Diagnosis not present

## 2021-10-07 DIAGNOSIS — N941 Unspecified dyspareunia: Secondary | ICD-10-CM | POA: Diagnosis not present

## 2021-10-07 DIAGNOSIS — N898 Other specified noninflammatory disorders of vagina: Secondary | ICD-10-CM | POA: Diagnosis not present

## 2021-10-07 DIAGNOSIS — R110 Nausea: Secondary | ICD-10-CM | POA: Diagnosis not present

## 2021-10-07 DIAGNOSIS — F418 Other specified anxiety disorders: Secondary | ICD-10-CM | POA: Diagnosis not present

## 2021-10-07 DIAGNOSIS — N644 Mastodynia: Secondary | ICD-10-CM | POA: Diagnosis not present

## 2021-10-16 NOTE — Progress Notes (Signed)
Acute Office Visit  Subjective:    Patient ID: Amy Johnston, female    DOB: 03-12-1981, 41 y.o.   MRN: [id number]  Introduced myself to the patient as a PA-C and provided education on APPs in clinical practice.    CC: Cough   Cough This is a new problem. Episode onset: Started about a month ago. The problem has been unchanged. The cough is Productive of sputum. Associated symptoms include chest pain (related to frequent coughing), postnasal drip, rhinorrhea, shortness of breath and wheezing. Pertinent negatives include no chills, ear pain, fever, headaches, heartburn, hemoptysis or sore throat. She has tried OTC cough suppressant for the symptoms. The treatment provided no relief.  Patient is in today for productive Cough for one month. - states she had a brief cold one month ago that started her current symptoms Reports chest congestion, nasal congestion,  Denies sore throat, fever, fatigue  Has tried taking Sudafed that appears to help with congestion Nyquil and humidifier are not helping to provide lasting sleep Has been drinking warm liquids-tea and soups without relief.  Reports numerous negative COVID tests Has not been seen previously for these symptoms Reports worsening of symptoms in last 2 weeks since beginning.    Past Medical History:  Diagnosis Date   Allergy    Anxiety    Cancer ([place])    Depression    Hypertension    Osteoporosis     Past Surgical History:  Procedure Laterality Date   ABDOMINAL HYSTERECTOMY     BREAST SURGERY     EYE SURGERY      Family History  Problem Relation Age of Onset   Healthy Mother    Healthy Father    Healthy Brother    Celiac disease Daughter     Social History   Socioeconomic History   Marital status: Married    Spouse name: Not on file   Number of children: Not on file   Years of education: Not on file   Highest education level: Not on file  Occupational History   Not on file  Tobacco Use   Smoking status: Never    Smokeless tobacco: Never  Vaping Use   Vaping Use: Never used  Substance and Sexual Activity   Alcohol use: Never   Drug use: Never   Sexual activity: Not on file  Other Topics Concern   Not on file  Social History Narrative   Not on file   Social Determinants of Health   Financial Resource Strain: Not on file  Food Insecurity: Not on file  Transportation Needs: Not on file  Physical Activity: Not on file  Stress: Not on file  Social Connections: Not on file  Intimate Partner Violence: Not on file    Outpatient Medications Prior to Visit  Medication Sig Dispense Refill   letrozole (FEMARA) 2.5 MG tablet Take by mouth.     lisinopril (ZESTRIL) 10 MG tablet Take 1 tablet (10 mg total) by mouth daily. 90 tablet 1   sertraline (ZOLOFT) 100 MG tablet Take 1 tablet (100 mg total) by mouth daily. Please schedule an office visit before anymore refills. 30 tablet 0   No facility-administered medications prior to visit.    Allergies  Allergen Reactions   Codeine     N/V N/V N/V N/V     Review of Systems  Constitutional:  Positive for fatigue. Negative for chills and fever.  HENT:  Positive for congestion, postnasal drip, rhinorrhea and sinus pressure. Negative for ear  discharge, ear pain, sinus pain, sneezing, sore throat, tinnitus and trouble swallowing.   Eyes: Negative.   Respiratory:  Positive for cough, shortness of breath and wheezing. Negative for apnea, hemoptysis, choking, chest tightness and stridor.   Cardiovascular:  Positive for chest pain (related to frequent coughing).  Gastrointestinal: Negative.  Negative for heartburn.  Neurological:  Positive for light-headedness. Negative for dizziness and headaches.      Objective:    Physical Exam Vitals reviewed.  Constitutional:      Appearance: Normal appearance. She is normal weight.  HENT:     Head: Normocephalic and atraumatic.     Nose: Congestion present.     Mouth/Throat:     Mouth: Mucous  membranes are moist.     Pharynx: Posterior oropharyngeal erythema present.  Eyes:     Extraocular Movements: Extraocular movements intact.     Conjunctiva/sclera: Conjunctivae normal.     Pupils: Pupils are equal, round, and reactive to light.  Cardiovascular:     Rate and Rhythm: Regular rhythm. Tachycardia present.     Pulses: Normal pulses.     Heart sounds: Normal heart sounds.  Pulmonary:     Effort: Pulmonary effort is normal.     Breath sounds: Normal breath sounds.  Musculoskeletal:     Cervical back: Normal range of motion and neck supple.  Neurological:     Mental Status: She is alert.    There were no vitals taken for this visit. Wt Readings from Last 3 Encounters:  10/03/20 150 lb (68 kg)  08/21/20 146 lb 3.2 oz (66.3 kg)    Health Maintenance Due  Topic Date Due   Pneumococcal Vaccine 24-75 Years old (1 - PCV) Never done   HIV Screening  Never done   Hepatitis C Screening  Never done   TETANUS/TDAP  Never done   PAP SMEAR-Modifier  Never done   COVID-19 Vaccine (4 - Booster) 10/11/2020   INFLUENZA VACCINE  04/29/2021    There are no preventive care reminders to display for this patient.   No results found for: TSH No results found for: WBC, HGB, HCT, MCV, PLT No results found for: NA, K, CHLORIDE, CO2, GLUCOSE, BUN, CREATININE, BILITOT, ALKPHOS, AST, ALT, PROT, ALBUMIN, CALCIUM, ANIONGAP, EGFR, GFR No results found for: CHOL No results found for: HDL No results found for: LDLCALC No results found for: TRIG No results found for: CHOLHDL No results found for: HGBA1C     Assessment & Plan:   Problem List Items Addressed This Visit   None 1. Nasopharyngitis Acute, with lingering symptoms for a month. Reports mild improvement followed by worsening of symptoms prompting visit Associated symptoms - nasal congestion, fatigue, SOB, lightheadedness.  Given chronicity of symptoms and lack of improvement with OTC measures, I suspect bacterial sinusitis and  provided rx for Amoxicillin 500 mg PO TID x 5 days  - amoxicillin (AMOXIL) 500 MG tablet; Take 1 tablet (500 mg total) by mouth every 8 (eight) hours for 5 days.  Dispense: 15 tablet; Refill: 0  2. Acute cough Acute, likely secondary to sinusitis.  Provided Tessalon pearls to assist with cough Also recommend OTC symptom management   - benzonatate (TESSALON) 200 MG capsule; Take 1 capsule (200 mg total) by mouth 2 (two) times daily as needed for cough.  Dispense: 20 capsule; Refill: 0  3. Shortness of breath Acute, likely secondary to sinusitis and prolonged illness  Recommend continued use of humidifier at night and provided albuterol inhaler for further relief.  -  albuterol (VENTOLIN HFA) 108 (90 Base) MCG/ACT inhaler; Inhale 2 puffs into the lungs every 6 (six) hours as needed for wheezing or shortness of breath.  Dispense: 8 g; Refill: 0  4. Reactive depression (situational) Chronic and stable, managed with Zoloft 18m PO QD  Refill provided per patient request.   - sertraline (ZOLOFT) 100 MG tablet; Take 1 tablet (100 mg total) by mouth daily. Please schedule an office visit before anymore refills.  Dispense: 30 tablet; Refill: 0    No orders of the defined types were placed in this encounter.   I,Laura E Walsh,acting as a sEducation administratorfor [employer] PA-C.,have documented all relevant documentation on the behalf of [place] PA-C,as directed by  [employer] PA-C while in the presence of Amy Perillo E Fiore Detjen, PA-C.   Davionte Lusby E Eryn Krejci, PA-C  The entirety of the information documented in the History of Present Illness, Review of Systems and Physical Exam were personally obtained by me. Portions of this information were initially documented by the CMA and reviewed by me for thoroughness and accuracy.   Kameshia Madruga E Ilamae Geng, PA-C

## 2021-10-17 ENCOUNTER — Other Ambulatory Visit: Payer: Self-pay

## 2021-10-17 ENCOUNTER — Ambulatory Visit (INDEPENDENT_AMBULATORY_CARE_PROVIDER_SITE_OTHER): Payer: BC Managed Care – PPO | Admitting: Physician Assistant

## 2021-10-17 ENCOUNTER — Encounter: Payer: Self-pay | Admitting: Physician Assistant

## 2021-10-17 VITALS — BP 126/83 | HR 82 | Temp 97.6°F | Wt 156.0 lb

## 2021-10-17 DIAGNOSIS — R051 Acute cough: Secondary | ICD-10-CM

## 2021-10-17 DIAGNOSIS — J00 Acute nasopharyngitis [common cold]: Secondary | ICD-10-CM | POA: Diagnosis not present

## 2021-10-17 DIAGNOSIS — R0602 Shortness of breath: Secondary | ICD-10-CM | POA: Diagnosis not present

## 2021-10-17 DIAGNOSIS — F329 Major depressive disorder, single episode, unspecified: Secondary | ICD-10-CM | POA: Diagnosis not present

## 2021-10-17 MED ORDER — AMOXICILLIN 500 MG PO TABS: 500.0000 mg | ORAL_TABLET | Freq: Three times a day (TID) | ORAL | 0 refills | Status: AC

## 2021-10-17 MED ORDER — SERTRALINE HCL 100 MG PO TABS: 100.0000 mg | ORAL_TABLET | Freq: Every day | ORAL | 0 refills | Status: DC

## 2021-10-17 MED ORDER — BENZONATATE 200 MG PO CAPS: 200.0000 mg | ORAL_CAPSULE | Freq: Two times a day (BID) | ORAL | 0 refills | Status: DC | PRN

## 2021-10-17 MED ORDER — ALBUTEROL SULFATE HFA 108 (90 BASE) MCG/ACT IN AERS: 2.0000 | INHALATION_SPRAY | Freq: Four times a day (QID) | RESPIRATORY_TRACT | 0 refills | Status: DC | PRN

## 2021-10-17 NOTE — Patient Instructions (Signed)
Based on your symptoms I believe you likely had a viral infection that primed your sinuses for a bacterial infection and that is what is currently causing your symptoms   I am sending you a prescription for Amoxicillin 500 mg to be taken by mouth every 8 hours for 5 days Please finish the entire course of antibiotic  I am sending in a prescription for Tessalon pearls 200 mg to be taken as needed twice per day I am sending in a script for an Albuterol inhaler (rescue inhaler) to help with your breathing.   You can continue to take Sudafed, Nyquil to help relieve your symptoms as needed.   Get plenty of rest, stay well hydrated and continue to use the humidifier at night.   If your symptoms do not improve or become worse in the next 5-7 days please make an apt at the office so we can see you  Go to the ER if you begin to have more serious symptoms such as shortness of breath, trouble breathing, loss of consciousness, swelling around the eyes, high fever, severe lasting headaches, vision changes or neck pain/stiffness.

## 2021-10-18 DIAGNOSIS — Z170 Estrogen receptor positive status [ER+]: Secondary | ICD-10-CM | POA: Diagnosis not present

## 2021-10-18 DIAGNOSIS — C50412 Malignant neoplasm of upper-outer quadrant of left female breast: Secondary | ICD-10-CM | POA: Diagnosis not present

## 2021-10-29 ENCOUNTER — Encounter: Payer: Self-pay | Admitting: Physician Assistant

## 2021-10-29 DIAGNOSIS — Z9013 Acquired absence of bilateral breasts and nipples: Secondary | ICD-10-CM | POA: Diagnosis not present

## 2021-10-29 DIAGNOSIS — Z1239 Encounter for other screening for malignant neoplasm of breast: Secondary | ICD-10-CM | POA: Diagnosis not present

## 2021-10-29 DIAGNOSIS — C50412 Malignant neoplasm of upper-outer quadrant of left female breast: Secondary | ICD-10-CM | POA: Diagnosis not present

## 2021-10-29 DIAGNOSIS — N644 Mastodynia: Secondary | ICD-10-CM | POA: Diagnosis not present

## 2021-10-29 DIAGNOSIS — Z170 Estrogen receptor positive status [ER+]: Secondary | ICD-10-CM | POA: Diagnosis not present

## 2021-10-31 DIAGNOSIS — R918 Other nonspecific abnormal finding of lung field: Secondary | ICD-10-CM | POA: Diagnosis not present

## 2021-10-31 DIAGNOSIS — M542 Cervicalgia: Secondary | ICD-10-CM | POA: Diagnosis not present

## 2021-10-31 DIAGNOSIS — C50412 Malignant neoplasm of upper-outer quadrant of left female breast: Secondary | ICD-10-CM | POA: Diagnosis not present

## 2021-10-31 DIAGNOSIS — Z170 Estrogen receptor positive status [ER+]: Secondary | ICD-10-CM | POA: Diagnosis not present

## 2021-11-04 ENCOUNTER — Ambulatory Visit: Payer: BC Managed Care – PPO | Admitting: Family Medicine

## 2021-11-04 NOTE — Progress Notes (Signed)
Acute Office Visit  Subjective:    Patient ID: Amy Johnston, female    DOB: [birth year], 41 y.o.   MRN: [id number]  No chief complaint on file.   HPI Patient is in today for evaluation of persistent cough.  She states she has had symptoms for 6 weeks.  She has during that time been treated with antibiotics and cough syrup but said it did not help.  Patient states she tested for Covid 10 days ago and it was negative.  States she has been using Ventolin for cough and shortness of breath but does not feel it is helping.  Past Medical History:  Diagnosis Date   Allergy    Anxiety    Cancer ([place])    Depression    Hypertension    Osteoporosis     Past Surgical History:  Procedure Laterality Date   ABDOMINAL HYSTERECTOMY     BREAST SURGERY     EYE SURGERY      Family History  Problem Relation Age of Onset   Healthy Mother    Healthy Father    Healthy Brother    Celiac disease Daughter     Social History   Socioeconomic History   Marital status: Married    Spouse name: Not on file   Number of children: Not on file   Years of education: Not on file   Highest education level: Not on file  Occupational History   Not on file  Tobacco Use   Smoking status: Never   Smokeless tobacco: Never  Vaping Use   Vaping Use: Never used  Substance and Sexual Activity   Alcohol use: Never   Drug use: Never   Sexual activity: Not on file  Other Topics Concern   Not on file  Social History Narrative   Not on file   Social Determinants of Health   Financial Resource Strain: Not on file  Food Insecurity: Not on file  Transportation Needs: Not on file  Physical Activity: Not on file  Stress: Not on file  Social Connections: Not on file  Intimate Partner Violence: Not on file    Outpatient Medications Prior to Visit  Medication Sig Dispense Refill   albuterol (VENTOLIN HFA) 108 (90 Base) MCG/ACT inhaler Inhale 2 puffs into the lungs every 6 (six) hours as needed for wheezing or  shortness of breath. 8 g 0   benzonatate (TESSALON) 200 MG capsule Take 1 capsule (200 mg total) by mouth 2 (two) times daily as needed for cough. 20 capsule 0   letrozole (FEMARA) 2.5 MG tablet Take by mouth.     sertraline (ZOLOFT) 100 MG tablet Take 1 tablet (100 mg total) by mouth daily. Please schedule an office visit before anymore refills. 30 tablet 0   lisinopril (ZESTRIL) 10 MG tablet Take 1 tablet (10 mg total) by mouth daily. 90 tablet 1   No facility-administered medications prior to visit.    Allergies  Allergen Reactions   Codeine     N/V N/V N/V N/V     Review of Systems  Constitutional:  Negative for fever.  HENT:  Positive for congestion.   Respiratory:  Positive for cough, shortness of breath and wheezing.   Cardiovascular:  Positive for chest pain.      Objective:    Physical Exam Vitals and nursing note reviewed.  Constitutional:      General: She is not in acute distress.    Appearance: Normal appearance. She is overweight. She is not  ill-appearing, toxic-appearing or diaphoretic.  HENT:     Head: Normocephalic and atraumatic.     Right Ear: Tympanic membrane, ear canal and external ear normal. There is no impacted cerumen.     Left Ear: Tympanic membrane, ear canal and external ear normal. There is no impacted cerumen.     Nose: Nose normal. No congestion or rhinorrhea.     Mouth/Throat:     Mouth: Mucous membranes are moist.     Pharynx: Oropharynx is clear. No oropharyngeal exudate or posterior oropharyngeal erythema.  Eyes:     Conjunctiva/sclera: Conjunctivae normal.     Pupils: Pupils are equal, round, and reactive to light.  Neck:     Vascular: No carotid bruit.  Cardiovascular:     Rate and Rhythm: Normal rate and regular rhythm.     Pulses: Normal pulses.     Heart sounds: Normal heart sounds. No murmur heard.   No friction rub. No gallop.  Pulmonary:     Effort: Pulmonary effort is normal. No respiratory distress.     Breath sounds:  Decreased air movement present. No stridor. Examination of the right-lower field reveals decreased breath sounds. Examination of the left-lower field reveals decreased breath sounds. Decreased breath sounds present. No wheezing, rhonchi or rales.     Comments: Report tight, wheezing Report feelings of heaviness of chest; denies CP Chest:     Chest wall: No tenderness.  Abdominal:     General: Bowel sounds are normal.     Palpations: Abdomen is soft.  Musculoskeletal:        General: No swelling, tenderness, deformity or signs of injury. Normal range of motion.     Cervical back: Normal range of motion and neck supple. No rigidity or tenderness.     Right lower leg: No edema.     Left lower leg: No edema.  Lymphadenopathy:     Cervical: No cervical adenopathy.  Skin:    General: Skin is warm and dry.     Capillary Refill: Capillary refill takes less than 2 seconds.     Coloration: Skin is not jaundiced or pale.     Findings: No bruising, erythema, lesion or rash.  Neurological:     General: No focal deficit present.     Mental Status: She is alert and oriented to person, place, and time. Mental status is at baseline.     Cranial Nerves: No cranial nerve deficit.     Sensory: No sensory deficit.     Motor: No weakness.     Coordination: Coordination normal.  Psychiatric:        Mood and Affect: Mood normal.        Behavior: Behavior normal.        Thought Content: Thought content normal.        Judgment: Judgment normal.    BP (!) 134/91 (BP Location: Right Wrist, Patient Position: Sitting, Cuff Size: Normal)    Pulse 81    Temp 97.7 F (36.5 C) (Oral)    Wt 158 lb (71.7 kg)    SpO2 99%    BMI 27.99 kg/m  Wt Readings from Last 3 Encounters:  11/05/21 158 lb (71.7 kg)  10/17/21 156 lb (70.8 kg)  10/03/20 150 lb (68 kg)   Vitals:   11/05/21 1349 11/05/21 1353  BP: (!) 135/97 (!) 134/91  Pulse: 81   Temp: 97.7 F (36.5 C)   TempSrc: Oral   SpO2: 99%   Weight: 158 lb (71.7  kg)  There are no preventive care reminders to display for this patient.   There are no preventive care reminders to display for this patient.   No results found for: TSH No results found for: WBC, HGB, HCT, MCV, PLT No results found for: NA, K, CHLORIDE, CO2, GLUCOSE, BUN, CREATININE, BILITOT, ALKPHOS, AST, ALT, PROT, ALBUMIN, CALCIUM, ANIONGAP, EGFR, GFR No results found for: CHOL No results found for: HDL No results found for: LDLCALC No results found for: TRIG No results found for: CHOLHDL No results found for: HGBA1C     Assessment & Plan:   Problem List Items Addressed This Visit       Cardiovascular and Mediastinum   Essential hypertension    On ACEi; recommend increase to two tablets with report back on home reads Report of chest heaviness; denies CP Contracted to safety to call 9-1-1 or go to ER if she develops CP/Chest pressure/SOB etc      Relevant Medications   lisinopril (ZESTRIL) 10 MG tablet     Respiratory   Moderate persistent asthma with exacerbation    Start inhaler, steroid in office, taper starting 2/8 Report of tight/wheezing chest      Relevant Medications   Fluticasone-Umeclidin-Vilant (TRELEGY ELLIPTA) 100-62.5-25 MCG/ACT AEPB   predniSONE (STERAPRED UNI-PAK 21 TAB) 10 MG (21) TBPK tablet   Walking pneumonia - Primary    Hold off CXR given recent imaging from [place]. Recommend two further abx to assist      Relevant Medications   azithromycin (ZITHROMAX) 250 MG tablet   doxycycline (VIBRA-TABS) 100 MG tablet   predniSONE (STERAPRED UNI-PAK 21 TAB) 10 MG (21) TBPK tablet   fluconazole (DIFLUCAN) 150 MG tablet     Musculoskeletal and Integument   Cut of hand, initial encounter    Dropped glass into garbage disposal while it was on; cut hand on garbage disposal, and with glass shards TDAP due        Other   Immunocompromised state ([place])    Hx of breast cancer; waiting PET scan OK Error on side of caution, continue to treat  cough Vaccinated Has been on allergy treatment Start asthma controller vs as needed use, as she's been using 1-2 times/day      Relevant Medications   predniSONE (STERAPRED UNI-PAK 21 TAB) 10 MG (21) TBPK tablet   Need for diphtheria-tetanus-pertussis (Tdap) vaccine    D/t metal/glass cut on R hand      Relevant Orders   Tdap vaccine greater than or equal to 7yo IM (Completed)   Antibiotic-induced yeast infection    Antifungal provided given hx of immunocompromised state and concern for yeast infection      Relevant Medications   azithromycin (ZITHROMAX) 250 MG tablet   fluconazole (DIFLUCAN) 150 MG tablet     Meds ordered this encounter  Medications   azithromycin (ZITHROMAX) 250 MG tablet    Sig: Take 2 tablets on day 1, then 1 tablet daily on days 2 through 5    Dispense:  6 tablet    Refill:  0    Order Specific Question:   Supervising Provider    Answer:   Virginia Crews [[id number]]   doxycycline (VIBRA-TABS) 100 MG tablet    Sig: Take 1 tablet (100 mg total) by mouth 2 (two) times daily.    Dispense:  14 tablet    Refill:  0    Order Specific Question:   Supervising Provider    Answer:   Virginia Crews [[id number]]  Fluticasone-Umeclidin-Vilant (TRELEGY ELLIPTA) 100-62.5-25 MCG/ACT AEPB    Sig: Inhale 1 puff into the lungs 2 (two) times daily.    Dispense:  60 each    Refill:  2    Order Specific Question:   Supervising Provider    Answer:   Virginia Crews [[id number]]   predniSONE (STERAPRED UNI-PAK 21 TAB) 10 MG (21) TBPK tablet    Sig: Take in am, with food. Take as directed on package.    Dispense:  1 each    Refill:  0    Order Specific Question:   Supervising Provider    Answer:   Virginia Crews [[id number]]   fluconazole (DIFLUCAN) 150 MG tablet    Sig: Take one pill; if symptoms on day four, take the second dose.    Dispense:  2 tablet    Refill:  0    Order Specific Question:   Supervising Provider    Answer:   Virginia Crews  [[id number]]   methylPREDNISolone acetate (DEPO-MEDROL) injection 40 mg   lisinopril (ZESTRIL) 10 MG tablet    Sig: Take 2 tablets (20 mg total) by mouth daily.    Dispense:  90 tablet    Refill:  1    Order Specific Question:   Supervising Provider    Answer:   Virginia Crews [[id number]]    Ar[place]ina Ponder [place],acting as a scribe for Gwyneth Sprout, FNP.,have documented all relevant documentation on the behalf of Gwyneth Sprout, FNP,as directed by  Gwyneth Sprout, FNP while in the presence of Gwyneth Sprout, FNP.

## 2021-11-05 ENCOUNTER — Ambulatory Visit (INDEPENDENT_AMBULATORY_CARE_PROVIDER_SITE_OTHER): Payer: BC Managed Care – PPO | Admitting: Family Medicine

## 2021-11-05 ENCOUNTER — Encounter: Payer: Self-pay | Admitting: Family Medicine

## 2021-11-05 ENCOUNTER — Other Ambulatory Visit: Payer: Self-pay

## 2021-11-05 VITALS — BP 134/91 | HR 81 | Temp 97.7°F | Wt 158.0 lb

## 2021-11-05 DIAGNOSIS — J4541 Moderate persistent asthma with (acute) exacerbation: Secondary | ICD-10-CM | POA: Diagnosis not present

## 2021-11-05 DIAGNOSIS — Z23 Encounter for immunization: Secondary | ICD-10-CM | POA: Diagnosis not present

## 2021-11-05 DIAGNOSIS — J189 Pneumonia, unspecified organism: Secondary | ICD-10-CM | POA: Diagnosis not present

## 2021-11-05 DIAGNOSIS — I10 Essential (primary) hypertension: Secondary | ICD-10-CM

## 2021-11-05 DIAGNOSIS — B379 Candidiasis, unspecified: Secondary | ICD-10-CM | POA: Insufficient documentation

## 2021-11-05 DIAGNOSIS — D849 Immunodeficiency, unspecified: Secondary | ICD-10-CM | POA: Insufficient documentation

## 2021-11-05 DIAGNOSIS — T3695XA Adverse effect of unspecified systemic antibiotic, initial encounter: Secondary | ICD-10-CM

## 2021-11-05 DIAGNOSIS — S61419A Laceration without foreign body of unspecified hand, initial encounter: Secondary | ICD-10-CM | POA: Diagnosis not present

## 2021-11-05 MED ORDER — FLUCONAZOLE 150 MG PO TABS: ORAL_TABLET | ORAL | 0 refills | Status: DC

## 2021-11-05 MED ORDER — DOXYCYCLINE HYCLATE 100 MG PO TABS: 100.0000 mg | ORAL_TABLET | Freq: Two times a day (BID) | ORAL | 0 refills | Status: DC

## 2021-11-05 MED ORDER — PREDNISONE 10 MG (21) PO TBPK: ORAL_TABLET | ORAL | 0 refills | Status: DC

## 2021-11-05 MED ORDER — TRELEGY ELLIPTA 100-62.5-25 MCG/ACT IN AEPB: 1.0000 | INHALATION_SPRAY | Freq: Two times a day (BID) | RESPIRATORY_TRACT | 2 refills | Status: DC

## 2021-11-05 MED ORDER — LISINOPRIL 10 MG PO TABS: 20.0000 mg | ORAL_TABLET | Freq: Every day | ORAL | 1 refills | Status: DC

## 2021-11-05 MED ORDER — AZITHROMYCIN 250 MG PO TABS
ORAL_TABLET | ORAL | 0 refills | Status: AC
Start: 2021-11-05 — End: 2021-11-10

## 2021-11-05 MED ORDER — METHYLPREDNISOLONE ACETATE 40 MG/ML IJ SUSP
40.0000 mg | Freq: Once | INTRAMUSCULAR | Status: AC
  Administered 2021-11-05: 40 mg via INTRAMUSCULAR

## 2021-11-05 NOTE — Assessment & Plan Note (Signed)
Hx of breast cancer; waiting PET scan OK Error on side of caution, continue to treat cough Vaccinated Has been on allergy treatment Start asthma controller vs as needed use, as she's been using 1-2 times/day

## 2021-11-05 NOTE — Assessment & Plan Note (Signed)
Dropped glass into garbage disposal while it was on; cut hand on garbage disposal, and with glass shards TDAP due

## 2021-11-05 NOTE — Assessment & Plan Note (Signed)
Hold off CXR given recent imaging from [place]. Recommend two further abx to assist

## 2021-11-05 NOTE — Assessment & Plan Note (Signed)
Start inhaler, steroid in office, taper starting 2/8 Report of tight/wheezing chest

## 2021-11-05 NOTE — Assessment & Plan Note (Signed)
Antifungal provided given hx of immunocompromised state and concern for yeast infection

## 2021-11-05 NOTE — Assessment & Plan Note (Signed)
D/t metal/glass cut on R hand

## 2021-11-05 NOTE — Assessment & Plan Note (Signed)
On ACEi; recommend increase to two tablets with report back on home reads Report of chest heaviness; denies CP Contracted to safety to call 9-1-1 or go to ER if she develops CP/Chest pressure/SOB etc

## 2021-11-08 ENCOUNTER — Other Ambulatory Visit: Payer: Self-pay | Admitting: Physician Assistant

## 2021-11-08 DIAGNOSIS — F329 Major depressive disorder, single episode, unspecified: Secondary | ICD-10-CM

## 2021-11-08 NOTE — Telephone Encounter (Signed)
Requested medications are due for refill today.  yes  Requested medications are on the active medications list.  yes  Last refill. 10/17/2021 #30 0 refills  Future visit scheduled.   no  Notes to clinic.  Medication not delegated. Pharmacy needs Dx code.    Requested Prescriptions  Pending Prescriptions Disp Refills   sertraline (ZOLOFT) 100 MG tablet [Pharmacy Med Name: SERTRALINE HCL 100 MG TABLET] 90 tablet 1    Sig: Take 1 tablet (100 mg total) by mouth daily. Please schedule an office visit before anymore refills.     Not Delegated - Psychiatry:  Antidepressants - SSRI - sertraline Failed - 11/08/2021  1:34 PM      Failed - This refill cannot be delegated      Failed - AST in normal range and within 360 days    No results found for: POCAST, AST        Failed - ALT in normal range and within 360 days    No results found for: ALT, LABALT, POCALT        Passed - Completed PHQ-2 or PHQ-9 in the last 360 days      Passed - Valid encounter within last 6 months    Recent Outpatient Visits           3 days ago Walking pneumonia   [facility] Gwyneth Sprout, FNP   3 weeks ago [place], Dani Gobble, PA-C   1 year ago No-show for appointment   [facility], Kelby Aline, FNP   1 year ago Low back pain, unspecified back pain laterality, unspecified chronicity, unspecified whether sciatica present   [facility] [place], Kelby Aline, FNP   1 year ago Routine health maintenance   [place], Kelby Aline, FNP

## 2021-11-19 DIAGNOSIS — C50412 Malignant neoplasm of upper-outer quadrant of left female breast: Secondary | ICD-10-CM | POA: Diagnosis not present

## 2021-11-19 DIAGNOSIS — R918 Other nonspecific abnormal finding of lung field: Secondary | ICD-10-CM | POA: Diagnosis not present

## 2021-11-19 DIAGNOSIS — Z170 Estrogen receptor positive status [ER+]: Secondary | ICD-10-CM | POA: Diagnosis not present

## 2021-11-19 DIAGNOSIS — Z79899 Other long term (current) drug therapy: Secondary | ICD-10-CM | POA: Diagnosis not present

## 2021-12-16 ENCOUNTER — Telehealth: Payer: Self-pay | Admitting: Family Medicine

## 2021-12-16 MED ORDER — LISINOPRIL 10 MG PO TABS: 20.0000 mg | ORAL_TABLET | Freq: Every day | ORAL | 1 refills | Status: DC

## 2021-12-16 NOTE — Telephone Encounter (Signed)
[facility] pharmacy faxed refill request for the following medications: ? ?lisinopril (ZESTRIL) 10 MG tablet ? ? ?Please advise ? ?

## 2021-12-16 NOTE — Telephone Encounter (Signed)
Rx was resent 

## 2022-01-23 DIAGNOSIS — M62838 Other muscle spasm: Secondary | ICD-10-CM | POA: Diagnosis not present

## 2022-01-23 DIAGNOSIS — F418 Other specified anxiety disorders: Secondary | ICD-10-CM | POA: Diagnosis not present

## 2022-01-23 DIAGNOSIS — Z170 Estrogen receptor positive status [ER+]: Secondary | ICD-10-CM | POA: Diagnosis not present

## 2022-01-23 DIAGNOSIS — C50412 Malignant neoplasm of upper-outer quadrant of left female breast: Secondary | ICD-10-CM | POA: Diagnosis not present

## 2022-01-23 DIAGNOSIS — N9419 Other specified dyspareunia: Secondary | ICD-10-CM | POA: Diagnosis not present

## 2022-01-23 DIAGNOSIS — R32 Unspecified urinary incontinence: Secondary | ICD-10-CM | POA: Diagnosis not present

## 2022-01-23 DIAGNOSIS — M6281 Muscle weakness (generalized): Secondary | ICD-10-CM | POA: Diagnosis not present

## 2022-01-23 DIAGNOSIS — Z9013 Acquired absence of bilateral breasts and nipples: Secondary | ICD-10-CM | POA: Diagnosis not present

## 2022-01-23 DIAGNOSIS — R102 Pelvic and perineal pain: Secondary | ICD-10-CM | POA: Diagnosis not present

## 2022-02-21 DIAGNOSIS — R102 Pelvic and perineal pain: Secondary | ICD-10-CM | POA: Diagnosis not present

## 2022-02-21 DIAGNOSIS — M6281 Muscle weakness (generalized): Secondary | ICD-10-CM | POA: Diagnosis not present

## 2022-02-21 DIAGNOSIS — M62838 Other muscle spasm: Secondary | ICD-10-CM | POA: Diagnosis not present

## 2022-02-21 DIAGNOSIS — R32 Unspecified urinary incontinence: Secondary | ICD-10-CM | POA: Diagnosis not present

## 2022-02-27 DIAGNOSIS — D485 Neoplasm of uncertain behavior of skin: Secondary | ICD-10-CM | POA: Diagnosis not present

## 2022-02-27 DIAGNOSIS — L280 Lichen simplex chronicus: Secondary | ICD-10-CM | POA: Diagnosis not present

## 2022-03-18 ENCOUNTER — Other Ambulatory Visit: Payer: Self-pay | Admitting: Family Medicine

## 2022-03-18 DIAGNOSIS — F329 Major depressive disorder, single episode, unspecified: Secondary | ICD-10-CM

## 2022-03-18 DIAGNOSIS — I10 Essential (primary) hypertension: Secondary | ICD-10-CM

## 2022-03-18 NOTE — Telephone Encounter (Signed)
Requested medication (s) are due for refill today: yes   Requested medication (s) are on the active medication list: yes   Last refill:  zoloft 11/11/21 #90 1 refill, zetril 12/16/21 #90 1 refill   Future visit scheduled: no   Notes to clinic:  called patient to schedule appt for medication refills. No answer, LVMTCB. Do you want to refill Rx? No results noted for labs      Requested Prescriptions  Pending Prescriptions Disp Refills   sertraline (ZOLOFT) 100 MG tablet [Pharmacy Med Name: SERTRALINE HCL 100 MG TABLET] 90 tablet 1    Sig: TAKE 1 TABLET (100 MG TOTAL) BY MOUTH DAILY. PLEASE SCHEDULE AN OFFICE VISIT BEFORE ANYMORE REFILLS.     Psychiatry:  Antidepressants - SSRI - sertraline Failed - 03/18/2022 10:23 AM      Failed - AST in normal range and within 360 days    No results found for: "POCAST", "AST"       Failed - ALT in normal range and within 360 days    No results found for: "ALT", "LABALT", "POCALT"       Passed - Completed PHQ-2 or PHQ-9 in the last 360 days      Passed - Valid encounter within last 6 months    Recent Outpatient Visits           4 months ago Walking pneumonia   [facility] Tally Joe T, FNP   5 months ago Nasopharyngitis   [employer], Dani Gobble, PA-C   1 year ago No-show for appointment   [facility], Kelby Aline, FNP   1 year ago Low back pain, unspecified back pain laterality, unspecified chronicity, unspecified whether sciatica present   [facility], Kelby Aline, FNP   1 year ago Routine health maintenance   [place], Kelby Aline, FNP               lisinopril (ZESTRIL) 10 MG tablet [Pharmacy Med Name: LISINOPRIL 10 MG TABLET] 180 tablet     Sig: TAKE 2 TABLETS BY MOUTH EVERY DAY     Cardiovascular:  ACE Inhibitors Failed - 03/18/2022 10:23 AM      Failed - Cr in normal range and within 180 days    No results found for:  "CREATININE", "LABCREAU", "LABCREA", "POCCRE"       Failed - K in normal range and within 180 days    No results found for: "K", "POTASSIUM", "POCK"       Failed - Last BP in normal range    BP Readings from Last 1 Encounters:  11/05/21 (!) 134/91         Passed - Patient is not pregnant      Passed - Valid encounter within last 6 months    Recent Outpatient Visits           4 months ago Walking pneumonia   [facility] Gwyneth Sprout, FNP   5 months ago Nasopharyngitis   [employer], Dani Gobble, PA-C   1 year ago No-show for appointment   [facility], Kelby Aline, FNP   1 year ago Low back pain, unspecified back pain laterality, unspecified chronicity, unspecified whether sciatica present   [facility] [place], Kelby Aline, FNP   1 year ago Routine health maintenance   [place], Kelby Aline, FNP

## 2022-03-18 NOTE — Telephone Encounter (Signed)
Called patient to schedule appt for medication refills. No answer, LVMTCB #[phone]. 

## 2022-03-28 NOTE — Progress Notes (Unsigned)
Established patient visit   Patient: Amy Johnston   DOB: [birth year]   41 y.o. Female  MRN: [id number] Visit Date: 04/02/2022  Today's healthcare provider: Gwyneth Sprout, FNP  Introduced to nurse practitioner role and practice setting.  All questions answered.  Discussed provider/patient relationship and expectations.  I,Miliano Cotten J Elaine Middleton,acting as a scribe for Gwyneth Sprout, FNP.,have documented all relevant documentation on the behalf of Gwyneth Sprout, FNP,as directed by  Gwyneth Sprout, FNP while in the presence of Gwyneth Sprout, FNP.   Chief Complaint  Patient presents with   Hypertension   Anxiety   Subjective    HPI  Anxiety, Follow-up  She was last seen for anxiety 5 months ago. Changes made at last visit include continue medication.   She reports excellent compliance with treatment. She reports excellent tolerance of treatment. She is not having side effects.   She feels her anxiety is mild and Unchanged since last visit.  Symptoms: No chest pain No difficulty concentrating  No dizziness No fatigue  No feelings of losing control No insomnia  No irritable No palpitations  No panic attacks No racing thoughts  No shortness of breath No sweating  No tremors/shakes    GAD-7 Results    04/02/2022    4:07 PM  GAD-7 Generalized Anxiety Disorder Screening Tool  1. Feeling Nervous, Anxious, or on Edge 0  2. Not Being Able to Stop or Control Worrying 0  3. Worrying Too Much About Different Things 1  4. Trouble Relaxing 0  5. Being So Restless it's Hard To Sit Still 0  6. Becoming Easily Annoyed or Irritable 0  7. Feeling Afraid As If Something Awful Might Happen 0  Total GAD-7 Score 1  Difficulty At Work, Home, or Getting  Along With Others? Not difficult at all    PHQ-9 Scores    04/02/2022    4:06 PM 10/17/2021    1:25 PM 10/03/2020    8:12 AM  PHQ9 SCORE ONLY  PHQ-9 Total Score 3 2 0     ---------------------------------------------------------------------------------------------------  Hypertension, follow-up  BP Readings from Last 3 Encounters:  04/02/22 119/81  11/05/21 (!) 134/91  10/17/21 126/83   Wt Readings from Last 3 Encounters:  04/02/22 166 lb (75.3 kg)  11/05/21 158 lb (71.7 kg)  10/17/21 156 lb (70.8 kg)     She was last seen for hypertension 5 months ago.  BP at that visit was 134/91. Management since that visit includes continue medication.  She reports excellent compliance with treatment. She is not having side effects.  She is following a Regular diet. She is not exercising. She does not smoke.  Use of agents associated with hypertension: none.   Outside blood pressures are around 120/84. Symptoms: No chest pain No chest pressure  No palpitations No syncope  No dyspnea No orthopnea  No paroxysmal nocturnal dyspnea No lower extremity edema   ---------------------------------------------------------------------------------------------------   Medications: Outpatient Medications Prior to Visit  Medication Sig   letrozole (Norman) 2.5 MG tablet Take by mouth.   [DISCONTINUED] albuterol (VENTOLIN HFA) 108 (90 Base) MCG/ACT inhaler Inhale 2 puffs into the lungs every 6 (six) hours as needed for wheezing or shortness of breath.   [DISCONTINUED] benzonatate (TESSALON) 200 MG capsule Take 1 capsule (200 mg total) by mouth 2 (two) times daily as needed for cough.   [DISCONTINUED] doxycycline (VIBRA-TABS) 100 MG tablet Take 1 tablet (100 mg total) by mouth 2 (two) times daily.   [  DISCONTINUED] fluconazole (DIFLUCAN) 150 MG tablet Take one pill; if symptoms on day four, take the second dose.   [DISCONTINUED] Fluticasone-Umeclidin-Vilant (TRELEGY ELLIPTA) 100-62.5-25 MCG/ACT AEPB Inhale 1 puff into the lungs 2 (two) times daily.   [DISCONTINUED] lisinopril (ZESTRIL) 10 MG tablet TAKE 2 TABLETS BY MOUTH EVERY DAY   [DISCONTINUED] predniSONE  (STERAPRED UNI-PAK 21 TAB) 10 MG (21) TBPK tablet Take in am, with food. Take as directed on package.   [DISCONTINUED] sertraline (ZOLOFT) 100 MG tablet TAKE 1 TABLET (100 MG TOTAL) BY MOUTH DAILY. PLEASE SCHEDULE AN OFFICE VISIT BEFORE ANYMORE REFILLS.   No facility-administered medications prior to visit.    Review of Systems  Last CBC No results found for: "WBC", "HGB", "HCT", "MCV", "MCH", "RDW", "PLT"    Objective    BP 119/81 (BP Location: Right Wrist, Patient Position: Sitting, Cuff Size: Normal)   Pulse 77   Temp 98.6 F (37 C) (Oral)   Resp 16   Ht '5\' 3"'$  (1.6 m)   Wt 166 lb (75.3 kg)   SpO2 99%   BMI 29.41 kg/m  BP Readings from Last 3 Encounters:  04/02/22 119/81  11/05/21 (!) 134/91  10/17/21 126/83   Wt Readings from Last 3 Encounters:  04/02/22 166 lb (75.3 kg)  11/05/21 158 lb (71.7 kg)  10/17/21 156 lb (70.8 kg)   SpO2 Readings from Last 3 Encounters:  04/02/22 99%  11/05/21 99%  10/03/20 100%      Physical Exam Vitals and nursing note reviewed.  Constitutional:      General: She is not in acute distress.    Appearance: Normal appearance. She is overweight. She is not ill-appearing, toxic-appearing or diaphoretic.  HENT:     Head: Normocephalic and atraumatic.  Cardiovascular:     Rate and Rhythm: Normal rate and regular rhythm.     Pulses: Normal pulses.     Heart sounds: Normal heart sounds. No murmur heard.    No friction rub. No gallop.  Pulmonary:     Effort: Pulmonary effort is normal. No respiratory distress.     Breath sounds: Normal breath sounds. No stridor. No wheezing, rhonchi or rales.  Chest:     Chest wall: No tenderness.  Abdominal:     General: Bowel sounds are normal.     Palpations: Abdomen is soft.  Musculoskeletal:        General: No swelling, tenderness, deformity or signs of injury. Normal range of motion.     Right lower leg: No edema.     Left lower leg: No edema.  Skin:    General: Skin is warm and dry.      Capillary Refill: Capillary refill takes less than 2 seconds.     Coloration: Skin is not jaundiced or pale.     Findings: No bruising, erythema, lesion or rash.  Neurological:     General: No focal deficit present.     Mental Status: She is alert and oriented to person, place, and time. Mental status is at baseline.     Cranial Nerves: No cranial nerve deficit.     Sensory: No sensory deficit.     Motor: No weakness.     Coordination: Coordination normal.  Psychiatric:        Mood and Affect: Mood normal.        Behavior: Behavior normal.        Thought Content: Thought content normal.        Judgment: Judgment normal.  No results found for any visits on 04/02/22.  Assessment & Plan     Problem List Items Addressed This Visit       Cardiovascular and Mediastinum   Essential hypertension    Chronic, stable Continue 10 mg x2 tablets/daily Continue to deny palpitations       Relevant Medications   lisinopril (ZESTRIL) 10 MG tablet   Other Relevant Orders   Lipid panel     Respiratory   Allergic rhinitis    Chronic, seasonal Request for refill of singulair to assist Continue to recommend use of claritin or zyrtec, plus flonase as well to assist      Relevant Medications   montelukast (SINGULAIR) 10 MG tablet     Other   Immunocompromised state (HCC)    Chronic, stable Continues to obtain infusions for history of breast cancer Has upcoming appt with oncology- check CBC, CMP for upcoming appt      Relevant Orders   Comprehensive metabolic panel   CBC with Differential/Platelet   TSH + free T4   Reactive depression (situational) - Primary    Chronic, stable Denies SI or HI Wishes to continue same dose of SSRI Will refer to psych if exacerbates       Relevant Medications   sertraline (ZOLOFT) 100 MG tablet   Screening cholesterol level    Hx of HTN Due for LP recommend diet low in saturated fat and regular exercise - 30 min at least 5 times per  week       Relevant Orders   Lipid panel     Return in about 6 months (around 10/03/2022) for annual examination.      Vonna Kotyk, FNP, have reviewed all documentation for this visit. The documentation on 04/02/22 for the exam, diagnosis, procedures, and orders are all accurate and complete.    Gwyneth Sprout, [place] [phone] (phone) [phone] (fax)  [place]

## 2022-04-02 ENCOUNTER — Ambulatory Visit (INDEPENDENT_AMBULATORY_CARE_PROVIDER_SITE_OTHER): Payer: BC Managed Care – PPO | Admitting: Family Medicine

## 2022-04-02 VITALS — BP 119/81 | HR 77 | Temp 98.6°F | Resp 16 | Ht 63.0 in | Wt 166.0 lb

## 2022-04-02 DIAGNOSIS — I10 Essential (primary) hypertension: Secondary | ICD-10-CM

## 2022-04-02 DIAGNOSIS — D849 Immunodeficiency, unspecified: Secondary | ICD-10-CM

## 2022-04-02 DIAGNOSIS — F329 Major depressive disorder, single episode, unspecified: Secondary | ICD-10-CM

## 2022-04-02 DIAGNOSIS — Z13220 Encounter for screening for lipoid disorders: Secondary | ICD-10-CM | POA: Diagnosis not present

## 2022-04-02 DIAGNOSIS — J301 Allergic rhinitis due to pollen: Secondary | ICD-10-CM

## 2022-04-02 MED ORDER — SERTRALINE HCL 100 MG PO TABS: 100.0000 mg | ORAL_TABLET | Freq: Every day | ORAL | 1 refills | Status: DC

## 2022-04-02 MED ORDER — MONTELUKAST SODIUM 10 MG PO TABS: 10.0000 mg | ORAL_TABLET | Freq: Every day | ORAL | 1 refills | Status: DC

## 2022-04-02 MED ORDER — LISINOPRIL 10 MG PO TABS: 20.0000 mg | ORAL_TABLET | Freq: Every day | ORAL | 1 refills | Status: DC

## 2022-04-02 NOTE — Assessment & Plan Note (Signed)
Hx of HTN Due for LP recommend diet low in saturated fat and regular exercise - 30 min at least 5 times per week

## 2022-04-02 NOTE — Assessment & Plan Note (Signed)
Chronic, stable Continue 10 mg x2 tablets/daily Continue to deny palpitations

## 2022-04-02 NOTE — Assessment & Plan Note (Signed)
Chronic, stable Denies SI or HI Wishes to continue same dose of SSRI Will refer to psych if exacerbates

## 2022-04-02 NOTE — Assessment & Plan Note (Signed)
Chronic, seasonal Request for refill of singulair to assist Continue to recommend use of claritin or zyrtec, plus flonase as well to assist

## 2022-04-02 NOTE — Assessment & Plan Note (Signed)
Chronic, stable Continues to obtain infusions for history of breast cancer Has upcoming appt with oncology- check CBC, CMP for upcoming appt

## 2022-04-03 LAB — CBC WITH DIFFERENTIAL/PLATELET
Basophils Absolute: 0.1 10*3/uL (ref 0.0–0.2)
Basos: 1 %
EOS (ABSOLUTE): 0.7 10*3/uL — ABNORMAL HIGH (ref 0.0–0.4)
Eos: 8 %
Hematocrit: 33.8 % — ABNORMAL LOW (ref 34.0–46.6)
Hemoglobin: 11.6 g/dL (ref 11.1–15.9)
Immature Grans (Abs): 0 10*3/uL (ref 0.0–0.1)
Immature Granulocytes: 1 %
Lymphocytes Absolute: 2.1 10*3/uL (ref 0.7–3.1)
Lymphs: 24 %
MCH: 31.4 pg (ref 26.6–33.0)
MCHC: 34.3 g/dL (ref 31.5–35.7)
MCV: 92 fL (ref 79–97)
Monocytes Absolute: 0.6 10*3/uL (ref 0.1–0.9)
Monocytes: 7 %
Neutrophils Absolute: 5.1 10*3/uL (ref 1.4–7.0)
Neutrophils: 59 %
Platelets: 251 10*3/uL (ref 150–450)
RBC: 3.69 x10E6/uL — ABNORMAL LOW (ref 3.77–5.28)
RDW: 12.5 % (ref 11.7–15.4)
WBC: 8.5 10*3/uL (ref 3.4–10.8)

## 2022-04-03 LAB — TSH+FREE T4
Free T4: 0.91 ng/dL (ref 0.82–1.77)
TSH: 0.785 u[IU]/mL (ref 0.450–4.500)

## 2022-04-03 LAB — COMPREHENSIVE METABOLIC PANEL
ALT: 12 IU/L (ref 0–32)
AST: 16 IU/L (ref 0–40)
Albumin/Globulin Ratio: 1.8 (ref 1.2–2.2)
Albumin: 4.5 g/dL (ref 3.8–4.8)
Alkaline Phosphatase: 76 IU/L (ref 44–121)
BUN/Creatinine Ratio: 17 (ref 9–23)
BUN: 13 mg/dL (ref 6–24)
Bilirubin Total: <0.2 mg/dL (ref 0.0–1.2)
CO2: 25 mmol/L (ref 20–29)
Calcium: 9.9 mg/dL (ref 8.7–10.2)
Chloride: 105 mmol/L (ref 96–106)
Creatinine, Ser: 0.76 mg/dL (ref 0.57–1.00)
Globulin, Total: 2.5 g/dL (ref 1.5–4.5)
Glucose: 105 mg/dL — ABNORMAL HIGH (ref 70–99)
Potassium: 4.3 mmol/L (ref 3.5–5.2)
Sodium: 141 mmol/L (ref 134–144)
Total Protein: 7 g/dL (ref 6.0–8.5)
eGFR: 100 mL/min/{1.73_m2} (ref >59)

## 2022-04-03 LAB — LIPID PANEL
Chol/HDL Ratio: 3.7 ratio (ref 0.0–4.4)
Cholesterol, Total: 198 mg/dL (ref 100–199)
HDL: 54 mg/dL (ref >39)
LDL Chol Calc (NIH): 120 mg/dL — ABNORMAL HIGH (ref 0–99)
Triglycerides: 134 mg/dL (ref 0–149)
VLDL Cholesterol Cal: 24 mg/dL (ref 5–40)

## 2022-04-03 NOTE — Progress Notes (Signed)
Blood chemistry stable, slight elevation in blood glucose. No concern given non-fasting.   Borderline anemia. However, all other levels stable.  Cholesterol stable; LDL remains slightly elevated with LDL at 120. recommend diet low in saturated fat and regular exercise - 30 min at least 5 times per week  Normal thyroid.  Enjoy your trip to [place].  Gwyneth Sprout, [address] [place], [place] [address] [phone] (phone) [phone] (fax) [place]

## 2022-04-07 DIAGNOSIS — Z9013 Acquired absence of bilateral breasts and nipples: Secondary | ICD-10-CM | POA: Diagnosis not present

## 2022-04-07 DIAGNOSIS — Z87891 Personal history of nicotine dependence: Secondary | ICD-10-CM | POA: Diagnosis not present

## 2022-04-07 DIAGNOSIS — M8589 Other specified disorders of bone density and structure, multiple sites: Secondary | ICD-10-CM | POA: Diagnosis not present

## 2022-04-07 DIAGNOSIS — F419 Anxiety disorder, unspecified: Secondary | ICD-10-CM | POA: Diagnosis not present

## 2022-04-07 DIAGNOSIS — R918 Other nonspecific abnormal finding of lung field: Secondary | ICD-10-CM | POA: Diagnosis not present

## 2022-04-07 DIAGNOSIS — R110 Nausea: Secondary | ICD-10-CM | POA: Diagnosis not present

## 2022-04-07 DIAGNOSIS — R109 Unspecified abdominal pain: Secondary | ICD-10-CM | POA: Diagnosis not present

## 2022-04-07 DIAGNOSIS — Z170 Estrogen receptor positive status [ER+]: Secondary | ICD-10-CM | POA: Diagnosis not present

## 2022-04-07 DIAGNOSIS — C50412 Malignant neoplasm of upper-outer quadrant of left female breast: Secondary | ICD-10-CM | POA: Diagnosis not present

## 2022-04-07 DIAGNOSIS — Z79899 Other long term (current) drug therapy: Secondary | ICD-10-CM | POA: Diagnosis not present

## 2022-04-07 DIAGNOSIS — I10 Essential (primary) hypertension: Secondary | ICD-10-CM | POA: Diagnosis not present

## 2022-04-28 DIAGNOSIS — M62838 Other muscle spasm: Secondary | ICD-10-CM | POA: Diagnosis not present

## 2022-04-28 DIAGNOSIS — R102 Pelvic and perineal pain: Secondary | ICD-10-CM | POA: Diagnosis not present

## 2022-04-28 DIAGNOSIS — R32 Unspecified urinary incontinence: Secondary | ICD-10-CM | POA: Diagnosis not present

## 2022-04-28 DIAGNOSIS — M6281 Muscle weakness (generalized): Secondary | ICD-10-CM | POA: Diagnosis not present

## 2022-06-08 IMAGING — CR DG ABDOMEN 1V
1 series · 2 of 2 positions shown · non-contrast
Comparison: None.

CLINICAL DATA: Left-sided flank pain for several weeks with
hematuria

EXAM:
ABDOMEN - 1 VIEW

[Series 1: dg abd 1 view · 0.14mm/px · 2 of 2 slices shown]
[im 1/2]
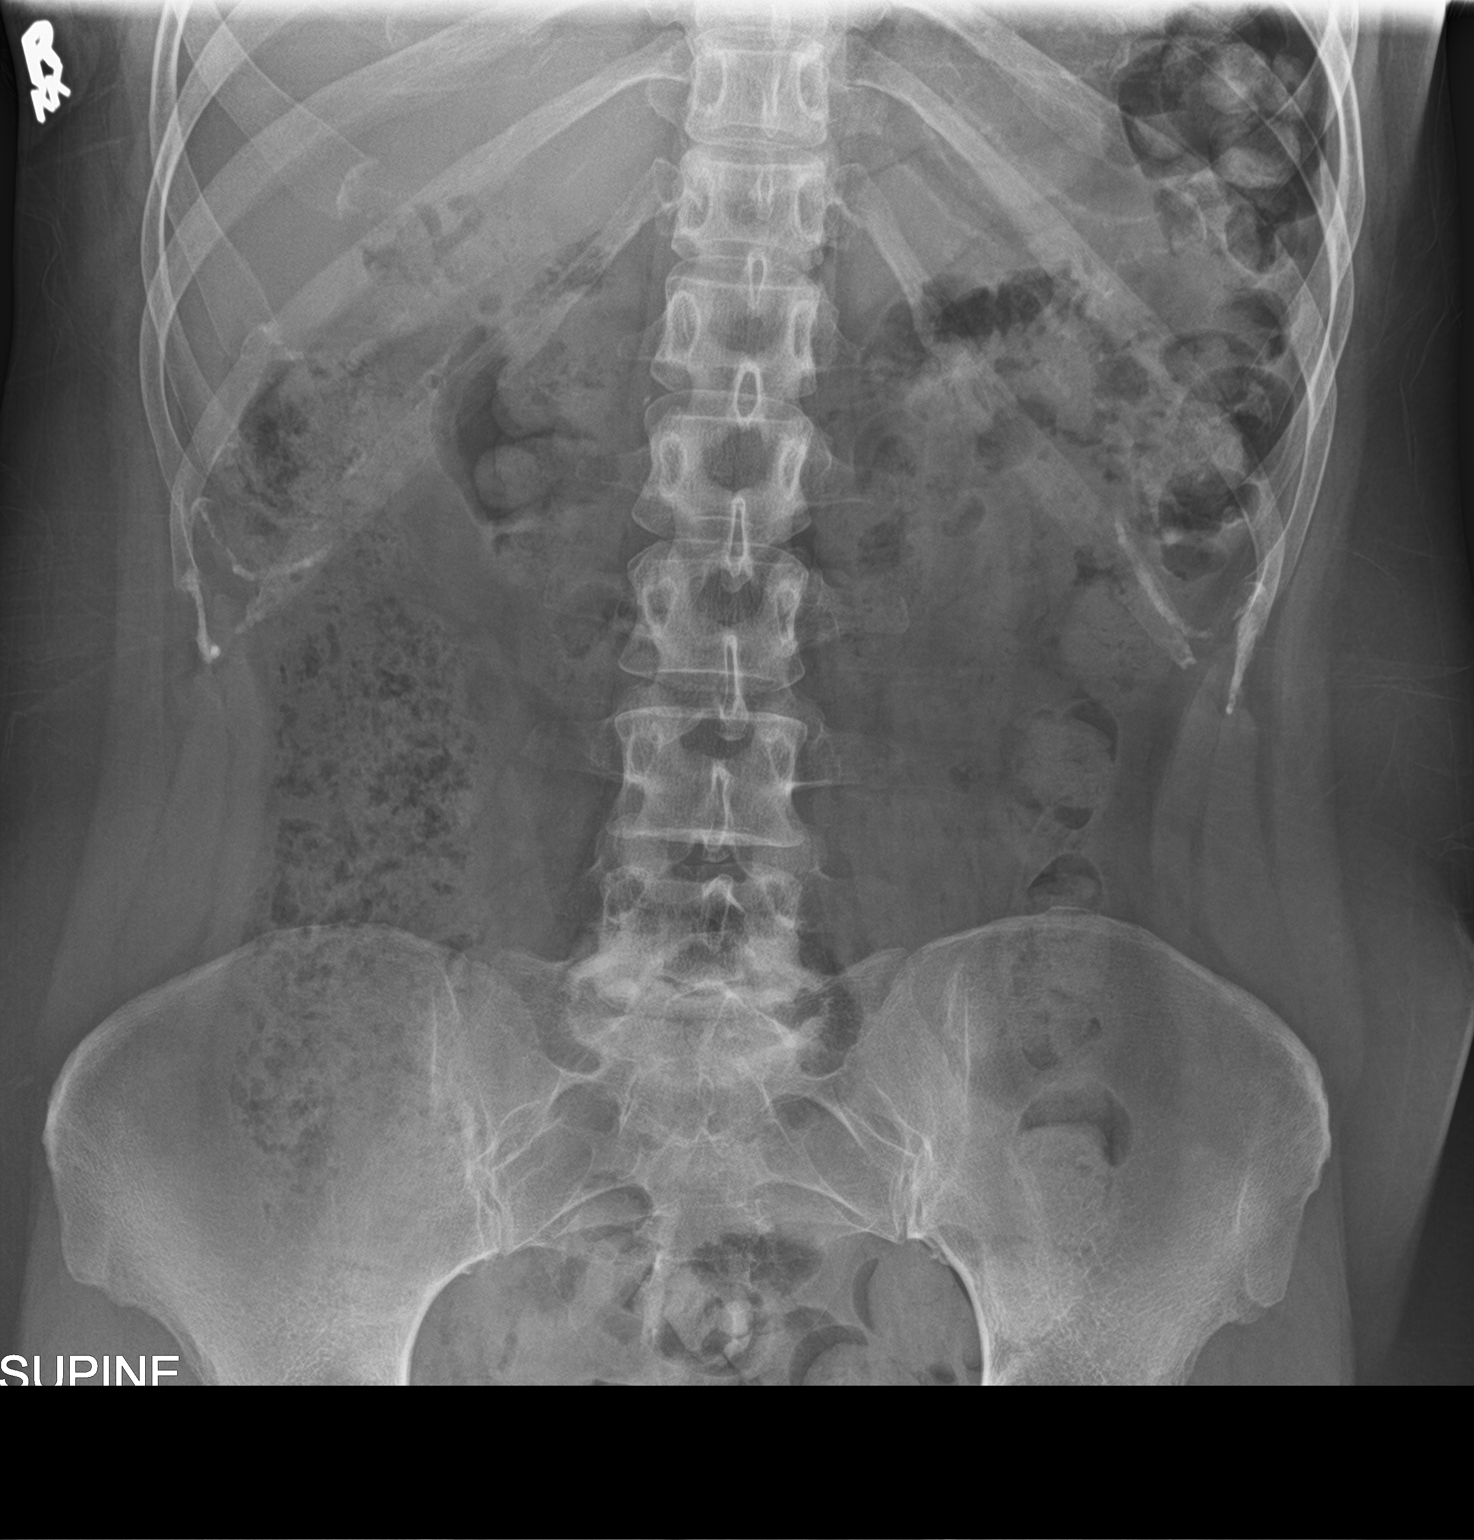
[im 2/2]
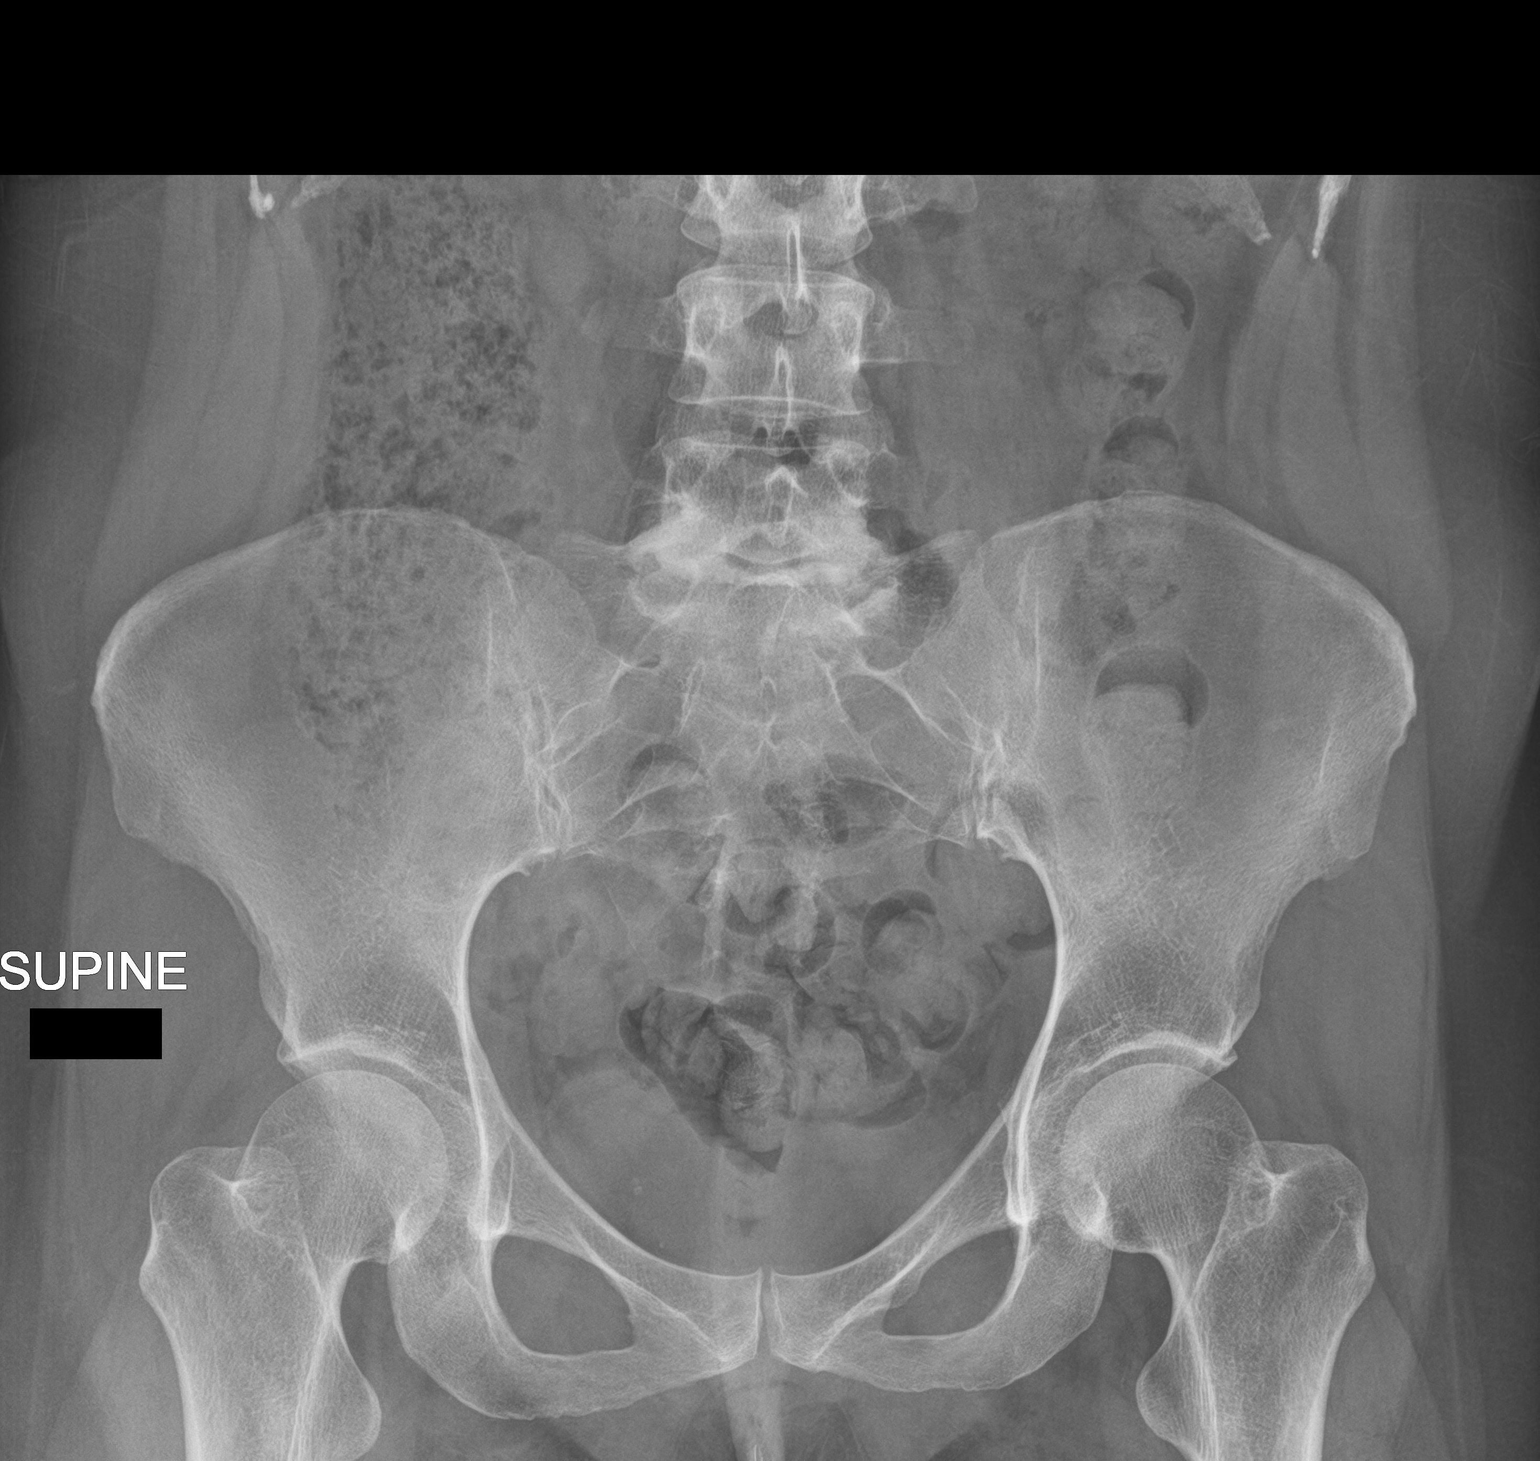

[2 of 2 positions shown; findings below may reference images not displayed]

FINDINGS: Scattered large and small bowel gas is noted. Mild residual fecal
material is seen without obstructive change. No definitive renal or
ureteral stones are seen. No bony abnormality is noted.
IMPRESSION: No definitive calculi identified.

## 2022-10-04 ENCOUNTER — Other Ambulatory Visit: Payer: Self-pay | Admitting: Family Medicine

## 2022-10-04 DIAGNOSIS — J301 Allergic rhinitis due to pollen: Secondary | ICD-10-CM

## 2022-10-08 DIAGNOSIS — R918 Other nonspecific abnormal finding of lung field: Secondary | ICD-10-CM | POA: Diagnosis not present

## 2022-10-08 DIAGNOSIS — J701 Chronic and other pulmonary manifestations due to radiation: Secondary | ICD-10-CM | POA: Diagnosis not present

## 2022-10-09 DIAGNOSIS — M8589 Other specified disorders of bone density and structure, multiple sites: Secondary | ICD-10-CM | POA: Diagnosis not present

## 2022-10-09 DIAGNOSIS — F32A Depression, unspecified: Secondary | ICD-10-CM | POA: Diagnosis not present

## 2022-10-09 DIAGNOSIS — I10 Essential (primary) hypertension: Secondary | ICD-10-CM | POA: Diagnosis not present

## 2022-10-09 DIAGNOSIS — Z79899 Other long term (current) drug therapy: Secondary | ICD-10-CM | POA: Diagnosis not present

## 2022-10-09 DIAGNOSIS — R110 Nausea: Secondary | ICD-10-CM | POA: Diagnosis not present

## 2022-10-09 DIAGNOSIS — N644 Mastodynia: Secondary | ICD-10-CM | POA: Diagnosis not present

## 2022-10-09 DIAGNOSIS — Z79811 Long term (current) use of aromatase inhibitors: Secondary | ICD-10-CM | POA: Diagnosis not present

## 2022-10-09 DIAGNOSIS — Z87891 Personal history of nicotine dependence: Secondary | ICD-10-CM | POA: Diagnosis not present

## 2022-10-09 DIAGNOSIS — Z170 Estrogen receptor positive status [ER+]: Secondary | ICD-10-CM | POA: Diagnosis not present

## 2022-10-09 DIAGNOSIS — R918 Other nonspecific abnormal finding of lung field: Secondary | ICD-10-CM | POA: Diagnosis not present

## 2022-10-09 DIAGNOSIS — F419 Anxiety disorder, unspecified: Secondary | ICD-10-CM | POA: Diagnosis not present

## 2022-10-09 DIAGNOSIS — C50412 Malignant neoplasm of upper-outer quadrant of left female breast: Secondary | ICD-10-CM | POA: Diagnosis not present

## 2022-10-19 ENCOUNTER — Encounter: Payer: Self-pay | Admitting: Emergency Medicine

## 2022-10-19 ENCOUNTER — Ambulatory Visit
Admission: EM | Admit: 2022-10-19 | Discharge: 2022-10-19 | Disposition: A | Discharge status: Home / Self Care | Payer: BC Managed Care – PPO | Attending: Family Medicine | Admitting: Family Medicine

## 2022-10-19 ENCOUNTER — Ambulatory Visit (INDEPENDENT_AMBULATORY_CARE_PROVIDER_SITE_OTHER): Payer: BC Managed Care – PPO

## 2022-10-19 DIAGNOSIS — M79605 Pain in left leg: Secondary | ICD-10-CM

## 2022-10-19 DIAGNOSIS — M79662 Pain in left lower leg: Secondary | ICD-10-CM | POA: Diagnosis not present

## 2022-10-19 MED ORDER — MELOXICAM 15 MG PO TABS: 15.0000 mg | ORAL_TABLET | Freq: Every day | ORAL | 0 refills | Status: DC | PRN

## 2022-10-19 NOTE — ED Provider Notes (Signed)
[facility]    CSN: [id number] Arrival date & time: 10/19/22  1518      History   Chief Complaint Chief Complaint  Patient presents with   Fall   Leg Pain    Left lower    HPI  42 year old female presents for evaluation of the above.  Patient states that she missed a step this morning and injured her left lower leg.  She did not actually fall.  She states that she went to work but that her pain worsened.  She states that she has difficulty bearing weight on the left leg.  Pain is 5/10 in severity.  She is concerned that she may have a fracture.  No reports of bruising.  No other associated symptoms.  No other complaints.  Past Medical History:  Diagnosis Date   Allergy    Anxiety    Cancer ([place])    Depression    Hypertension    Osteoporosis     Patient Active Problem List   Diagnosis Date Noted   Moderate persistent asthma with exacerbation 11/05/2021   History of breast cancer 08/21/2020   Osteopenia 08/21/2020   Vitamin D deficiency 08/21/2020   H/O total vaginal hysterectomy 08/21/2020   Routine health maintenance 08/21/2020   History of bilateral mastectomy 08/21/2020   History of radiation therapy 08/21/2020   History of cancer chemotherapy 08/21/2020   Palpitations 07/29/2018   Syncope and collapse 07/29/2018   History of abnormal Pap smear 07/11/2013   Allergic rhinitis 07/20/2012   Essential hypertension 07/20/2012   Reactive depression (situational) 07/20/2012    Past Surgical History:  Procedure Laterality Date   ABDOMINAL HYSTERECTOMY     BREAST SURGERY     EYE SURGERY      OB History   No obstetric history on file.      Home Medications    Prior to Admission medications   Medication Sig Start Date End Date Taking? Authorizing Provider  letrozole [facility]) 2.5 MG tablet Take by mouth. 07/09/18  Yes [provider]  lisinopril (ZESTRIL) 10 MG tablet Take 2 tablets (20 mg total) by mouth daily. 04/02/22  Yes Gwyneth Sprout, FNP  meloxicam (MOBIC) 15 MG tablet Take 1 tablet (15 mg total) by mouth daily as needed for pain. 10/19/22  Yes Amyrie Illingworth G, DO  montelukast (SINGULAIR) 10 MG tablet TAKE 1 TABLET BY MOUTH EVERYDAY AT BEDTIME 10/06/22  Yes Gwyneth Sprout, FNP  sertraline (ZOLOFT) 100 MG tablet Take 1 tablet (100 mg total) by mouth daily. 04/02/22  Yes Tally Joe T, FNP  zoledronic acid (ZOMETA) 4 MG/5ML injection Inject into the vein.    [provider]    Family History Family History  Problem Relation Age of Onset   Healthy Mother    Healthy Father    Healthy Brother    Celiac disease Daughter     Social History Social History   Tobacco Use   Smoking status: Never   Smokeless tobacco: Never  Vaping Use   Vaping Use: Never used  Substance Use Topics   Alcohol use: Never   Drug use: Never     Allergies   Codeine   Review of Systems Review of Systems Per HPI  Physical Exam Triage Vital Signs ED Triage Vitals  Enc Vitals Group     BP 10/19/22 1525 (!) 157/103     Pulse Rate 10/19/22 1525 79     Resp 10/19/22 1525 14     Temp  10/19/22 1525 98.3 F (36.8 C)     Temp Source 10/19/22 1525 Oral     SpO2 10/19/22 1525 96 %     Weight 10/19/22 1524 166 lb 0.1 oz (75.3 kg)     Height 10/19/22 1524 '5\' 3"'$  (1.6 m)     Head Circumference --      Peak Flow --      Pain Score 10/19/22 1523 5     Pain Loc --      Pain Edu? --      Excl. in [place]? --    Updated Vital Signs BP (!) 157/103 (BP Location: Left Arm)   Pulse 79   Temp 98.3 F (36.8 C) (Oral)   Resp 14   Ht '5\' 3"'$  (1.6 m)   Wt 75.3 kg   SpO2 96%   BMI 29.41 kg/m   Visual Acuity Right Eye Distance:   Left Eye Distance:   Bilateral Distance:    Right Eye Near:   Left Eye Near:    Bilateral Near:     Physical Exam Vitals and nursing note reviewed.  Constitutional:      General: She is not in acute distress.    Appearance: Normal appearance.  HENT:     Head: Normocephalic and atraumatic.  Eyes:      General:        Right eye: No discharge.        Left eye: No discharge.     Conjunctiva/sclera: Conjunctivae normal.  Pulmonary:     Effort: Pulmonary effort is normal. No respiratory distress.  Musculoskeletal:     Comments: Left lower leg -no appreciable bruising on inspection.  Mild tenderness of the left proximal lower leg.  No joint line tenderness of the knee.  No appreciable effusion.  Left ankle -normal range of motion.  No tenderness.  Neurological:     Mental Status: She is alert.    UC Treatments / Results  Labs (all labs ordered are listed, but only abnormal results are displayed) Labs Reviewed - No data to display  EKG   Radiology DG Tibia/Fibula Left  Result Date: 10/19/2022 CLINICAL DATA:  Upper leg pain. EXAM: LEFT TIBIA AND FIBULA - 2 VIEW COMPARISON:  None Available. FINDINGS: There is no evidence of fracture or other focal bone lesions. Soft tissues are unremarkable. IMPRESSION: Negative. Electronically Signed   By: Lovey Newcomer M.D.   On: 10/19/2022 15:53    Procedures Procedures (including critical care time)  Medications Ordered in UC Medications - No data to display  Initial Impression / Assessment and Plan / UC Course  I have reviewed the triage vital signs and the nursing notes.  Pertinent labs & imaging results that were available during my care of the patient were reviewed by me and considered in my medical decision making (see chart for details).    42 year old female presents with left lower leg pain.  X-ray was obtained and was independently reviewed by me.  There is no discernible fracture.  I suspect this is muscular injury.  Rest, ice, elevation.  Meloxicam as directed.  Supportive care.  Final Clinical Impressions(s) / UC Diagnoses   Final diagnoses:  Left leg pain     Discharge Instructions      Xray normal.  Rest, ice, elevation.  Medication as needed.  Take care  Dr. Lacinda Axon      ED Prescriptions     Medication  Sig Dispense Auth. Provider   meloxicam (MOBIC) 15 MG tablet  Take 1 tablet (15 mg total) by mouth daily as needed for pain. 30 tablet Coral Spikes, DO      PDMP not reviewed this encounter.   Coral Spikes, [place] 10/19/22 1559

## 2022-10-19 NOTE — Discharge Instructions (Signed)
Xray normal.  Rest, ice, elevation.  Medication as needed.  Take care  Dr. Lacinda Axon

## 2022-10-19 NOTE — ED Triage Notes (Signed)
Patient states that she fell and missed a step early this morning.  Patient reports pain in her left lower leg.  Patient denies hitting her head.

## 2022-10-27 ENCOUNTER — Encounter: Payer: Self-pay | Admitting: Family Medicine

## 2022-10-27 ENCOUNTER — Ambulatory Visit: Payer: BC Managed Care – PPO | Admitting: Family Medicine

## 2022-10-27 VITALS — BP 123/89 | HR 81 | Temp 98.7°F | Wt 158.3 lb

## 2022-10-27 DIAGNOSIS — I10 Essential (primary) hypertension: Secondary | ICD-10-CM

## 2022-10-27 DIAGNOSIS — F329 Major depressive disorder, single episode, unspecified: Secondary | ICD-10-CM

## 2022-10-27 DIAGNOSIS — R0789 Other chest pain: Secondary | ICD-10-CM | POA: Insufficient documentation

## 2022-10-27 DIAGNOSIS — E7800 Pure hypercholesterolemia, unspecified: Secondary | ICD-10-CM | POA: Insufficient documentation

## 2022-10-27 DIAGNOSIS — M79662 Pain in left lower leg: Secondary | ICD-10-CM | POA: Diagnosis not present

## 2022-10-27 MED ORDER — SERTRALINE HCL 100 MG PO TABS: 100.0000 mg | ORAL_TABLET | Freq: Every day | ORAL | 1 refills | Status: DC

## 2022-10-27 MED ORDER — LISINOPRIL 10 MG PO TABS: 15.0000 mg | ORAL_TABLET | Freq: Every day | ORAL | 1 refills | Status: DC

## 2022-10-27 NOTE — Progress Notes (Signed)
I,Connie R Striblin,acting as a Education administrator for Gwyneth Sprout, FNP.,have documented all relevant documentation on the behalf of Gwyneth Sprout, FNP,as directed by  Gwyneth Sprout, FNP while in the presence of Gwyneth Sprout, FNP.   Established patient visit   Patient: Amy Johnston   DOB: 08/30/1981   42 y.o. Female  MRN: [id number] Visit Date: 10/27/2022  Today's healthcare provider: Gwyneth Sprout, FNP  Re Introduced to nurse practitioner role and practice setting.  All questions answered.  Discussed provider/patient relationship and expectations.  Chief Complaint  Patient presents with   Hypertension   Subjective    HPI  Hypertension, follow-up  BP Readings from Last 3 Encounters:  10/27/22 123/89  10/19/22 (!) 157/103  04/02/22 119/81   Wt Readings from Last 3 Encounters:  10/27/22 158 lb 4.8 oz (71.8 kg)  10/19/22 166 lb 0.1 oz (75.3 kg)  04/02/22 166 lb (75.3 kg)     She was last seen for hypertension 6 months ago.  BP at that visit was 119/81. Management since that visit includes Continue 10 mg x2 tablets/daily .  She reports fair compliance with treatment- 1 '10mg'$  tablet daily unless Bp was high.  She is not having side effects.  She is following a Regular diet. Use of agents associated with hypertension: none.   Outside blood pressures are sometimes high and sometimes really low. highest Bp was 160/139 on 10/09/22 Symptoms: Yes chest pain Yes chest pressure  Yes palpitations No syncope  No dyspnea No orthopnea  No paroxysmal nocturnal dyspnea No lower extremity edema   Pt is concerned about her blood pressures drastically changing.  Pertinent labs Lab Results  Component Value Date   CHOL 198 04/02/2022   HDL 54 04/02/2022   LDLCALC 120 (H) 04/02/2022   TRIG 134 04/02/2022   CHOLHDL 3.7 04/02/2022   Lab Results  Component Value Date   NA 141 04/02/2022   K 4.3 04/02/2022   CREATININE 0.76 04/02/2022   EGFR 100 04/02/2022   GLUCOSE 105 (H) 04/02/2022   TSH  0.785 04/02/2022     The 10-year ASCVD risk score (Arnett DK, et al., 2019) is: 0.9%  ---------------------------------------------------------------------------------------------------   Medications: Outpatient Medications Prior to Visit  Medication Sig   letrozole (FEMARA) 2.5 MG tablet Take by mouth.   meloxicam (MOBIC) 15 MG tablet Take 1 tablet (15 mg total) by mouth daily as needed for pain.   montelukast (SINGULAIR) 10 MG tablet TAKE 1 TABLET BY MOUTH EVERYDAY AT BEDTIME   zoledronic acid (ZOMETA) 4 MG/5ML injection Inject into the vein.   [DISCONTINUED] lisinopril (ZESTRIL) 10 MG tablet Take 2 tablets (20 mg total) by mouth daily.   [DISCONTINUED] sertraline (ZOLOFT) 100 MG tablet Take 1 tablet (100 mg total) by mouth daily.   No facility-administered medications prior to visit.    Review of Systems    Objective    BP 123/89 (BP Location: Right Arm, Patient Position: Sitting, Cuff Size: Normal)   Pulse 81   Temp 98.7 F (37.1 C) (Oral)   Wt 158 lb 4.8 oz (71.8 kg)   SpO2 99%   BMI 28.04 kg/m   Physical Exam Vitals and nursing note reviewed.  Constitutional:      General: She is not in acute distress.    Appearance: Normal appearance. She is overweight. She is not ill-appearing, toxic-appearing or diaphoretic.  HENT:     Head: Normocephalic and atraumatic.  Cardiovascular:     Rate and Rhythm:  Normal rate and regular rhythm.     Pulses: Normal pulses.     Heart sounds: Normal heart sounds. No murmur heard.    No friction rub. No gallop.  Pulmonary:     Effort: Pulmonary effort is normal. No respiratory distress.     Breath sounds: Normal breath sounds. No stridor. No wheezing, rhonchi or rales.  Chest:     Chest wall: No tenderness.  Musculoskeletal:        General: Tenderness present. No swelling, deformity or signs of injury. Normal range of motion.     Right lower leg: No edema.     Left lower leg: No edema.     Comments: L neck tenderness with  neuropathy down L arm following previous cancer treatment  Skin:    General: Skin is warm and dry.     Capillary Refill: Capillary refill takes less than 2 seconds.     Coloration: Skin is not jaundiced or pale.     Findings: No bruising, erythema, lesion or rash.  Neurological:     General: No focal deficit present.     Mental Status: She is alert and oriented to person, place, and time. Mental status is at baseline.     Cranial Nerves: No cranial nerve deficit.     Sensory: No sensory deficit.     Motor: No weakness.     Coordination: Coordination normal.  Psychiatric:        Mood and Affect: Mood normal.        Behavior: Behavior normal.        Thought Content: Thought content normal.        Judgment: Judgment normal.     No results found for any visits on 10/27/22.  Assessment & Plan     Problem List Items Addressed This Visit       Cardiovascular and Mediastinum   Essential hypertension    Chronic, variable Goal <140/<90 Previously taking 10 mg Lisinopril; order was for 20 mg. Reports taking 10 mg and than an additional 10 mg if elevated. Encouraged to try 15 mg to assist; defers additional medication or change in medication together.      Relevant Medications   lisinopril (ZESTRIL) 10 MG tablet   Other Relevant Orders   CT CARDIAC SCORING (SELF PAY ONLY)   CBC   Basic Metabolic Panel (BMET)     Other   Atypical chest pain - Primary    Reports non exertional CP; feels like a squeezing sensation Negative family history for CAD/heart concerns Denies anxiety/panic Denies palpitations or irregular HR Coronary CT ordered for assistance; review of ASCVD risk with current BP      Relevant Orders   CT CARDIAC SCORING (SELF PAY ONLY)   Lipid panel   B Nat Peptide   CBC   Basic Metabolic Panel (BMET)   Magnesium   Elevated LDL cholesterol level    Chronic; previously elevated. Repeat LP. recommend diet low in saturated fat and regular exercise - 30 min at least 5  times per week       Relevant Orders   CT CARDIAC SCORING (SELF PAY ONLY)   Lipid panel   Pain of left calf    Acute, self limiting Reports acute pain following stepping off the stairs Was seen at [facility]; negative Xrays Additional referral placed to sports mgmt for further assistance if not improving with conservative treatment      Relevant Orders   Ambulatory referral to Sports Medicine  Reactive depression (situational)    Chronic, stable Continue zoloft at 100 mg; denies current concerns with depression. Stable on current dose per pt report.      Relevant Medications   sertraline (ZOLOFT) 100 MG tablet     Return in about 6 months (around 04/27/2023), or if symptoms worsen or fail to improve, for annual examination.      Vonna Kotyk, FNP, have reviewed all documentation for this visit. The documentation on 10/27/22 for the exam, diagnosis, procedures, and orders are all accurate and complete.    Gwyneth Sprout, [place] [phone] (phone) [phone] (fax)  [place]

## 2022-10-27 NOTE — Assessment & Plan Note (Signed)
Chronic, stable Continue zoloft at 100 mg; denies current concerns with depression. Stable on current dose per pt report.

## 2022-10-27 NOTE — Assessment & Plan Note (Signed)
Chronic, variable Goal <140/<90 Previously taking 10 mg Lisinopril; order was for 20 mg. Reports taking 10 mg and than an additional 10 mg if elevated. Encouraged to try 15 mg to assist; defers additional medication or change in medication together.

## 2022-10-27 NOTE — Assessment & Plan Note (Signed)
Chronic; previously elevated. Repeat LP. recommend diet low in saturated fat and regular exercise - 30 min at least 5 times per week

## 2022-10-27 NOTE — Patient Instructions (Addendum)
The 10-year ASCVD risk score (Arnett DK, et al., 2019) is: 0.9%   Values used to calculate the score:     Age: 42 years     Sex: Female     Is Non-Hispanic African American: No     Diabetic: No     Tobacco smoker: No     Systolic Blood Pressure: 368 mmHg     Is BP treated: Yes     HDL Cholesterol: 54 mg/dL     Total Cholesterol: 198 mg/dL

## 2022-10-27 NOTE — Assessment & Plan Note (Signed)
Acute, self limiting Reports acute pain following stepping off the stairs Was seen at [facility]; negative Xrays Additional referral placed to sports mgmt for further assistance if not improving with conservative treatment

## 2022-10-27 NOTE — Assessment & Plan Note (Signed)
Reports non exertional CP; feels like a squeezing sensation Negative family history for CAD/heart concerns Denies anxiety/panic Denies palpitations or irregular HR Coronary CT ordered for assistance; review of ASCVD risk with current BP

## 2022-10-28 LAB — BASIC METABOLIC PANEL
BUN/Creatinine Ratio: 25 — ABNORMAL HIGH (ref 9–23)
BUN: 17 mg/dL (ref 6–24)
CO2: 22 mmol/L (ref 20–29)
Calcium: 9.8 mg/dL (ref 8.7–10.2)
Chloride: 104 mmol/L (ref 96–106)
Creatinine, Ser: 0.68 mg/dL (ref 0.57–1.00)
Glucose: 97 mg/dL (ref 70–99)
Potassium: 4.7 mmol/L (ref 3.5–5.2)
Sodium: 143 mmol/L (ref 134–144)
eGFR: 111 mL/min/{1.73_m2} (ref >59)

## 2022-10-28 LAB — CBC
Hematocrit: 36.4 % (ref 34.0–46.6)
Hemoglobin: 12.1 g/dL (ref 11.1–15.9)
MCH: 31.4 pg (ref 26.6–33.0)
MCHC: 33.2 g/dL (ref 31.5–35.7)
MCV: 95 fL (ref 79–97)
Platelets: 251 10*3/uL (ref 150–450)
RBC: 3.85 x10E6/uL (ref 3.77–5.28)
RDW: 12.2 % (ref 11.7–15.4)
WBC: 8.1 10*3/uL (ref 3.4–10.8)

## 2022-10-28 LAB — LIPID PANEL
Chol/HDL Ratio: 3.7 ratio (ref 0.0–4.4)
Cholesterol, Total: 189 mg/dL (ref 100–199)
HDL: 51 mg/dL (ref >39)
LDL Chol Calc (NIH): 114 mg/dL — ABNORMAL HIGH (ref 0–99)
Triglycerides: 137 mg/dL (ref 0–149)
VLDL Cholesterol Cal: 24 mg/dL (ref 5–40)

## 2022-10-28 LAB — MAGNESIUM: Magnesium: 2.1 mg/dL (ref 1.6–2.3)

## 2022-10-28 LAB — BRAIN NATRIURETIC PEPTIDE: BNP: 70.2 pg/mL (ref 0.0–100.0)

## 2022-10-28 NOTE — Progress Notes (Signed)
Slightly elevated LDL/bad cholesterol. The 10-year ASCVD risk score (Arnett DK, et al., 2019) is: 0.9%   Values used to calculate the score:     Age: 42 years     Sex: Female     Is Non-Hispanic African American: No     Diabetic: No     Tobacco smoker: No     Systolic Blood Pressure: 166 mmHg     Is BP treated: Yes     HDL Cholesterol: 51 mg/dL     Total Cholesterol: 189 mg/dL  All other returned labs are normal and stable.

## 2022-10-29 NOTE — Progress Notes (Signed)
Normal BNP

## 2022-11-04 ENCOUNTER — Ambulatory Visit
Admission: RE | Admit: 2022-11-04 | Discharge: 2022-11-04 | Disposition: A | Discharge status: Home / Self Care | Payer: BC Managed Care – PPO | Source: Ambulatory Visit | Attending: Family Medicine | Admitting: Family Medicine

## 2022-11-04 DIAGNOSIS — R0789 Other chest pain: Secondary | ICD-10-CM

## 2022-11-04 DIAGNOSIS — I10 Essential (primary) hypertension: Secondary | ICD-10-CM

## 2022-11-04 DIAGNOSIS — E7800 Pure hypercholesterolemia, unspecified: Secondary | ICD-10-CM

## 2022-11-05 NOTE — Progress Notes (Signed)
CT of heart shows no build up of calcium or plaque. We continue to recommend diet low in saturated fat and regular exercise - 30 min at least 5 times per week. Annual lipids can be used to assist further care. Please let us know if you have any questions.  Thank you, Gwyneth Sprout, [address] [place], [place] [address] [phone] (phone) [phone] (fax) [place]

## 2022-12-22 ENCOUNTER — Other Ambulatory Visit: Payer: Self-pay | Admitting: Family Medicine

## 2022-12-22 DIAGNOSIS — I10 Essential (primary) hypertension: Secondary | ICD-10-CM

## 2023-01-13 DIAGNOSIS — Z170 Estrogen receptor positive status [ER+]: Secondary | ICD-10-CM | POA: Diagnosis not present

## 2023-01-13 DIAGNOSIS — C50412 Malignant neoplasm of upper-outer quadrant of left female breast: Secondary | ICD-10-CM | POA: Diagnosis not present

## 2023-02-02 ENCOUNTER — Other Ambulatory Visit: Payer: Self-pay | Admitting: Family Medicine

## 2023-02-02 DIAGNOSIS — F329 Major depressive disorder, single episode, unspecified: Secondary | ICD-10-CM

## 2023-02-02 DIAGNOSIS — J301 Allergic rhinitis due to pollen: Secondary | ICD-10-CM

## 2023-02-03 NOTE — Telephone Encounter (Signed)
Last refill Sertraline: 10/27/22 #90 1 RF Last refill montekulast: 10/06/22 #90 1 RF  Too soon Requested Prescriptions  Refused Prescriptions Disp Refills   sertraline (ZOLOFT) 100 MG tablet [Pharmacy Med Name: SERTRALINE HCL 100 MG TABLET] 90 tablet 1    Sig: TAKE 1 TABLET (100 MG TOTAL) BY MOUTH DAILY. PLEASE SCHEDULE AN OFFICE VISIT BEFORE ANYMORE REFILLS.     Psychiatry:  Antidepressants - SSRI - sertraline Passed - 02/02/2023 10:56 AM      Passed - AST in normal range and within 360 days    AST  Date Value Ref Range Status  04/02/2022 16 0 - 40 IU/L Final         Passed - ALT in normal range and within 360 days    ALT  Date Value Ref Range Status  04/02/2022 12 0 - 32 IU/L Final         Passed - Completed PHQ-2 or PHQ-9 in the last 360 days      Passed - Valid encounter within last 6 months    Recent Outpatient Visits           3 months ago Atypical chest pain   [facility] Merita Norton T, FNP   10 months ago Reactive depression (situational)   [facility] Jacky Kindle, FNP   1 year ago Walking pneumonia   [facility] Merita Norton T, FNP   1 year ago Nasopharyngitis   [facility] Mecum, Oswaldo Conroy, PA-C   2 years ago No-show for appointment   [facility] Flinchum, Eula Fried, FNP               montelukast (SINGULAIR) 10 MG tablet [Pharmacy Med Name: MONTELUKAST SOD 10 MG TABLET] 90 tablet 1    Sig: TAKE 1 TABLET BY MOUTH EVERYDAY AT BEDTIME     Pulmonology:  Leukotriene Inhibitors Passed - 02/02/2023 10:56 AM      Passed - Valid encounter within last 12 months    Recent Outpatient Visits           3 months ago Atypical chest pain   [facility] Merita Norton T, FNP   10 months ago Reactive depression (situational)   [facility] Jacky Kindle, FNP   1 year ago Walking pneumonia    [facility] Merita Norton T, FNP   1 year ago Nasopharyngitis     [facility] Mecum, Oswaldo Conroy, PA-C   2 years ago No-show for appointment   [facility] Flinchum, Eula Fried, [place]

## 2023-02-14 DIAGNOSIS — Z87891 Personal history of nicotine dependence: Secondary | ICD-10-CM | POA: Diagnosis not present

## 2023-02-14 DIAGNOSIS — Z973 Presence of spectacles and contact lenses: Secondary | ICD-10-CM | POA: Diagnosis not present

## 2023-02-14 DIAGNOSIS — H5789 Other specified disorders of eye and adnexa: Secondary | ICD-10-CM | POA: Diagnosis not present

## 2023-03-31 ENCOUNTER — Ambulatory Visit (INDEPENDENT_AMBULATORY_CARE_PROVIDER_SITE_OTHER): Payer: BC Managed Care – PPO | Admitting: Family Medicine

## 2023-03-31 VITALS — BP 128/82 | HR 73 | Ht 63.0 in | Wt 151.7 lb

## 2023-03-31 DIAGNOSIS — Z1159 Encounter for screening for other viral diseases: Secondary | ICD-10-CM | POA: Diagnosis not present

## 2023-03-31 DIAGNOSIS — R739 Hyperglycemia, unspecified: Secondary | ICD-10-CM | POA: Diagnosis not present

## 2023-03-31 DIAGNOSIS — F329 Major depressive disorder, single episode, unspecified: Secondary | ICD-10-CM | POA: Diagnosis not present

## 2023-03-31 DIAGNOSIS — Z114 Encounter for screening for human immunodeficiency virus [HIV]: Secondary | ICD-10-CM | POA: Diagnosis not present

## 2023-03-31 DIAGNOSIS — E7800 Pure hypercholesterolemia, unspecified: Secondary | ICD-10-CM | POA: Diagnosis not present

## 2023-03-31 DIAGNOSIS — I10 Essential (primary) hypertension: Secondary | ICD-10-CM

## 2023-03-31 DIAGNOSIS — A09 Infectious gastroenteritis and colitis, unspecified: Secondary | ICD-10-CM | POA: Insufficient documentation

## 2023-03-31 MED ORDER — LISINOPRIL 10 MG PO TABS: 20.0000 mg | ORAL_TABLET | Freq: Every day | ORAL | 1 refills | Status: DC

## 2023-03-31 MED ORDER — CIPROFLOXACIN HCL 500 MG PO TABS
500.0000 mg | ORAL_TABLET | Freq: Two times a day (BID) | ORAL | 0 refills | Status: AC
Start: 2023-03-31 — End: 2023-04-03

## 2023-03-31 MED ORDER — ONDANSETRON HCL 8 MG PO TABS: 8.0000 mg | ORAL_TABLET | Freq: Three times a day (TID) | ORAL | 0 refills | Status: DC | PRN

## 2023-03-31 MED ORDER — SERTRALINE HCL 100 MG PO TABS
100.0000 mg | ORAL_TABLET | Freq: Every day | ORAL | 1 refills | Status: DC
Start: 2023-03-31 — End: 2023-08-25

## 2023-03-31 NOTE — Assessment & Plan Note (Signed)
Low risk screen Treatable, and curable. If left untreated Hep C can lead to cirrhosis and liver failure. Encourage routine testing; recommend repeat testing if risk factors change.  

## 2023-03-31 NOTE — Assessment & Plan Note (Signed)
Low risk screen ?Consented; encouraged to "know your status" ?Recommend repeat screen if risk factors change ? ?

## 2023-03-31 NOTE — Assessment & Plan Note (Signed)
Acute on chronic; likely elevated d/t infectious diarrhea and/or dehydration Continue lisinopril and home checks BP goal 139/89 or less without co morbid conditions

## 2023-03-31 NOTE — Progress Notes (Signed)
Established patient visit   Patient: Amy Johnston   DOB: 01/07/1981   42 y.o. Female  MRN: [id number] Visit Date: 03/31/2023  Today's healthcare provider: Jacky Kindle, FNP  Re Introduced to nurse practitioner role and practice setting.  All questions answered.  Discussed provider/patient relationship and expectations.  Subjective    HPI HPI   Patient reports nausea and diarrhea of and on for the past month. Patient reports that she has taking zofran and imodium for her symptoms.  Last edited by Acey Lav, CMA on 03/31/2023  8:31 AM.       Medications: Outpatient Medications Prior to Visit  Medication Sig   Cholecalciferol (VITAMIN D3 PO) Take by mouth.   letrozole (FEMARA) 2.5 MG tablet Take by mouth.   montelukast (SINGULAIR) 10 MG tablet TAKE 1 TABLET BY MOUTH EVERYDAY AT BEDTIME   zoledronic acid (ZOMETA) 4 MG/5ML injection Inject into the vein.   [DISCONTINUED] lisinopril (ZESTRIL) 10 MG tablet TAKE 2 TABLETS BY MOUTH EVERY DAY   [DISCONTINUED] sertraline (ZOLOFT) 100 MG tablet Take 1 tablet (100 mg total) by mouth daily.   meloxicam (MOBIC) 15 MG tablet Take 1 tablet (15 mg total) by mouth daily as needed for pain. (Patient not taking: Reported on 03/31/2023)   No facility-administered medications prior to visit.    Review of Systems    Objective    BP 128/82 Comment: home readings  Pulse 73   Ht 5\' 3"  (1.6 m)   Wt 151 lb 11.2 oz (68.8 kg)   BMI 26.87 kg/m   Physical Exam Vitals and nursing note reviewed.  Constitutional:      General: She is not in acute distress.    Appearance: Normal appearance. She is overweight. She is not ill-appearing, toxic-appearing or diaphoretic.  HENT:     Head: Normocephalic and atraumatic.  Cardiovascular:     Rate and Rhythm: Normal rate and regular rhythm.     Pulses: Normal pulses.     Heart sounds: Normal heart sounds. No murmur heard.    No friction rub. No gallop.  Pulmonary:     Effort: Pulmonary effort is  normal. No respiratory distress.     Breath sounds: Normal breath sounds. No stridor. No wheezing, rhonchi or rales.  Chest:     Chest wall: No tenderness.  Abdominal:     General: Bowel sounds are normal. There is no distension.     Palpations: Abdomen is soft. There is no mass.     Tenderness: There is no abdominal tenderness. There is no guarding or rebound.     Hernia: No hernia is present.  Musculoskeletal:        General: No swelling, tenderness, deformity or signs of injury. Normal range of motion.     Right lower leg: No edema.     Left lower leg: No edema.  Skin:    General: Skin is warm and dry.     Capillary Refill: Capillary refill takes less than 2 seconds.     Coloration: Skin is not jaundiced or pale.     Findings: No bruising, erythema, lesion or rash.  Neurological:     General: No focal deficit present.     Mental Status: She is alert and oriented to person, place, and time. Mental status is at baseline.     Cranial Nerves: No cranial nerve deficit.     Sensory: No sensory deficit.     Motor: No weakness.     Coordination:  Coordination normal.  Psychiatric:        Mood and Affect: Mood normal.        Behavior: Behavior normal.        Thought Content: Thought content normal.        Judgment: Judgment normal.     No results found for any visits on 03/31/23.  Assessment & Plan     Problem List Items Addressed This Visit       Cardiovascular and Mediastinum   Essential hypertension    Acute on chronic; likely elevated d/t infectious diarrhea and/or dehydration Continue lisinopril and home checks BP goal 139/89 or less without co morbid conditions      Relevant Medications   lisinopril (ZESTRIL) 10 MG tablet   Other Relevant Orders   TSH + free T4     Digestive   Infectious diarrhea - Primary    Ongoing complaints of NVD; no sick contacts No travel Not improved with OTC regimen and anti-emetics Will start 3 day course of cipro Notes 2 relatives  with celiac and 1 distant relative with chron's Slight weight loss noted in past 6 months; not concerning Pt well appearing, not in distress Benign abdominal exam Discussed need for gastro referral as next step if needed       Relevant Medications   ciprofloxacin (CIPRO) 500 MG tablet   Other Relevant Orders   CBC with Differential/Platelet   Comprehensive Metabolic Panel (CMET)   Celiac Disease Ab Screen w/Rfx   H. pylori breath test     Other   Elevated LDL cholesterol level    Chronic, untreated given low risk ASCVD The 10-year ASCVD risk score (Arnett DK, et al., 2019) is: 1% Repeat LP recommend diet low in saturated fat and regular exercise - 30 min at least 5 times per week       Relevant Orders   Lipid panel   Elevated serum glucose    Repeat A1c given hx of elevated glucose, GI symptoms and weight loss Pt also endorses polydipsia  Continue to recommend balanced, lower carb meals. Smaller meal size, adding snacks. Choosing water as drink of choice and increasing purposeful exercise.       Relevant Orders   Hemoglobin A1c   Encounter for hepatitis C screening test for low risk patient    Low risk screen Treatable, and curable. If left untreated Hep C can lead to cirrhosis and liver failure. Encourage routine testing; recommend repeat testing if risk factors change.       Relevant Orders   Hepatitis C Antibody   Encounter for screening for HIV    Low risk screen Consented; encouraged to "know your status" Recommend repeat screen if risk factors change       Relevant Orders   HIV antibody (with reflex)   Reactive depression (situational)    Chronic, well controlled Request for refills of zoloft 100 mg       Relevant Medications   sertraline (ZOLOFT) 100 MG tablet   Return in about 1 week (around 04/07/2023), or if symptoms worsen or fail to improve.     Leilani Merl, FNP, have reviewed all documentation for this visit. The documentation on  03/31/23 for the exam, diagnosis, procedures, and orders are all accurate and complete.  Jacky Kindle, FNP  [facility] [phone] (phone) [phone] (fax)  [facility]

## 2023-03-31 NOTE — Assessment & Plan Note (Signed)
Repeat A1c given hx of elevated glucose, GI symptoms and weight loss Pt also endorses polydipsia  Continue to recommend balanced, lower carb meals. Smaller meal size, adding snacks. Choosing water as drink of choice and increasing purposeful exercise.

## 2023-03-31 NOTE — Assessment & Plan Note (Signed)
Chronic, untreated given low risk ASCVD The 10-year ASCVD risk score (Arnett DK, et al., 2019) is: 1% Repeat LP recommend diet low in saturated fat and regular exercise - 30 min at least 5 times per week

## 2023-03-31 NOTE — Assessment & Plan Note (Signed)
Chronic, well controlled Request for refills of zoloft 100 mg

## 2023-03-31 NOTE — Assessment & Plan Note (Signed)
Ongoing complaints of NVD; no sick contacts No travel Not improved with OTC regimen and anti-emetics Will start 3 day course of cipro Notes 2 relatives with celiac and 1 distant relative with chron's Slight weight loss noted in past 6 months; not concerning Pt well appearing, not in distress Benign abdominal exam Discussed need for gastro referral as next step if needed

## 2023-04-01 LAB — COMPREHENSIVE METABOLIC PANEL
AST: 20 IU/L (ref 0–40)
Alkaline Phosphatase: 79 IU/L (ref 44–121)
BUN/Creatinine Ratio: 17 (ref 9–23)
BUN: 12 mg/dL (ref 6–24)
Chloride: 104 mmol/L (ref 96–106)
Creatinine, Ser: 0.72 mg/dL (ref 0.57–1.00)
Globulin, Total: 2.6 g/dL (ref 1.5–4.5)
Total Protein: 7.7 g/dL (ref 6.0–8.5)

## 2023-04-01 LAB — LIPID PANEL
LDL Chol Calc (NIH): 159 mg/dL — ABNORMAL HIGH (ref 0–99)
Triglycerides: 131 mg/dL (ref 0–149)

## 2023-04-01 LAB — HEPATITIS C ANTIBODY: Hep C Virus Ab: NONREACTIVE

## 2023-04-01 LAB — CBC WITH DIFFERENTIAL/PLATELET
EOS (ABSOLUTE): 0.5 10*3/uL — ABNORMAL HIGH (ref 0.0–0.4)
Hematocrit: 38.2 % (ref 34.0–46.6)
Lymphs: 20 %
MCHC: 34 g/dL (ref 31.5–35.7)
MCV: 91 fL (ref 79–97)
WBC: 7.4 10*3/uL (ref 3.4–10.8)

## 2023-04-01 LAB — HIV ANTIBODY (ROUTINE TESTING W REFLEX): HIV Screen 4th Generation wRfx: NONREACTIVE

## 2023-04-01 LAB — TSH+FREE T4: Free T4: 1.02 ng/dL (ref 0.82–1.77)

## 2023-04-01 LAB — CELIAC DISEASE AB SCREEN W/RFX: IgA/Immunoglobulin A, Serum: 230 mg/dL (ref 87–352)

## 2023-04-01 NOTE — Progress Notes (Signed)
All labs are stable with exception of cholesterol which has increased from 6 months ago. Celiac and breath test pending. I continue to recommend diet low in saturated fat and regular exercise - 30 min at least 5 times per week  The 10-year ASCVD risk score (Arnett DK, et al., 2019) is: 1.3%

## 2023-04-02 LAB — COMPREHENSIVE METABOLIC PANEL
ALT: 13 IU/L (ref 0–32)
Albumin: 5.1 g/dL — ABNORMAL HIGH (ref 3.9–4.9)
Bilirubin Total: 0.4 mg/dL (ref 0.0–1.2)
CO2: 20 mmol/L (ref 20–29)
Calcium: 9.9 mg/dL (ref 8.7–10.2)
Glucose: 99 mg/dL (ref 70–99)
Potassium: 4.3 mmol/L (ref 3.5–5.2)
Sodium: 141 mmol/L (ref 134–144)
eGFR: 106 mL/min/{1.73_m2} (ref >59)

## 2023-04-02 LAB — CBC WITH DIFFERENTIAL/PLATELET
Basophils Absolute: 0.1 10*3/uL (ref 0.0–0.2)
Basos: 1 %
Eos: 7 %
Hemoglobin: 13 g/dL (ref 11.1–15.9)
Immature Grans (Abs): 0 10*3/uL (ref 0.0–0.1)
Immature Granulocytes: 0 %
Lymphocytes Absolute: 1.5 10*3/uL (ref 0.7–3.1)
MCH: 31.1 pg (ref 26.6–33.0)
Monocytes Absolute: 0.4 10*3/uL (ref 0.1–0.9)
Monocytes: 6 %
Neutrophils Absolute: 4.9 10*3/uL (ref 1.4–7.0)
Neutrophils: 66 %
Platelets: 266 10*3/uL (ref 150–450)
RBC: 4.18 x10E6/uL (ref 3.77–5.28)
RDW: 12.5 % (ref 11.7–15.4)

## 2023-04-02 LAB — LIPID PANEL
Chol/HDL Ratio: 4.1 ratio (ref 0.0–4.4)
Cholesterol, Total: 241 mg/dL — ABNORMAL HIGH (ref 100–199)
HDL: 59 mg/dL (ref >39)
VLDL Cholesterol Cal: 23 mg/dL (ref 5–40)

## 2023-04-02 LAB — HEMOGLOBIN A1C
Est. average glucose Bld gHb Est-mCnc: 111 mg/dL
Hgb A1c MFr Bld: 5.5 % (ref 4.8–5.6)

## 2023-04-02 LAB — TSH+FREE T4: TSH: 1.84 u[IU]/mL (ref 0.450–4.500)

## 2023-04-02 LAB — CELIAC DISEASE AB SCREEN W/RFX
Antigliadin Abs, IgA: 6 units (ref 0–19)
Transglutaminase IgA: <2 U/mL (ref 0–3)

## 2023-04-02 LAB — H. PYLORI BREATH TEST: H pylori Breath Test: NEGATIVE

## 2023-04-02 NOTE — Progress Notes (Signed)
Negative H Pylori and Celiac

## 2023-04-03 ENCOUNTER — Other Ambulatory Visit: Payer: Self-pay | Admitting: Family Medicine

## 2023-04-03 MED ORDER — FLUCONAZOLE 150 MG PO TABS: ORAL_TABLET | ORAL | 0 refills | Status: DC

## 2023-04-27 DIAGNOSIS — Z79811 Long term (current) use of aromatase inhibitors: Secondary | ICD-10-CM | POA: Diagnosis not present

## 2023-04-27 DIAGNOSIS — I10 Essential (primary) hypertension: Secondary | ICD-10-CM | POA: Diagnosis not present

## 2023-04-27 DIAGNOSIS — Z79899 Other long term (current) drug therapy: Secondary | ICD-10-CM | POA: Diagnosis not present

## 2023-04-27 DIAGNOSIS — Z90722 Acquired absence of ovaries, bilateral: Secondary | ICD-10-CM | POA: Diagnosis not present

## 2023-04-27 DIAGNOSIS — Z170 Estrogen receptor positive status [ER+]: Secondary | ICD-10-CM | POA: Diagnosis not present

## 2023-04-27 DIAGNOSIS — Z9013 Acquired absence of bilateral breasts and nipples: Secondary | ICD-10-CM | POA: Diagnosis not present

## 2023-04-27 DIAGNOSIS — F419 Anxiety disorder, unspecified: Secondary | ICD-10-CM | POA: Diagnosis not present

## 2023-04-27 DIAGNOSIS — Z87891 Personal history of nicotine dependence: Secondary | ICD-10-CM | POA: Diagnosis not present

## 2023-04-27 DIAGNOSIS — R918 Other nonspecific abnormal finding of lung field: Secondary | ICD-10-CM | POA: Diagnosis not present

## 2023-04-27 DIAGNOSIS — R110 Nausea: Secondary | ICD-10-CM | POA: Diagnosis not present

## 2023-04-27 DIAGNOSIS — C50412 Malignant neoplasm of upper-outer quadrant of left female breast: Secondary | ICD-10-CM | POA: Diagnosis not present

## 2023-07-13 ENCOUNTER — Other Ambulatory Visit: Payer: Self-pay | Admitting: Family Medicine

## 2023-07-13 ENCOUNTER — Encounter: Payer: Self-pay | Admitting: Family Medicine

## 2023-07-13 DIAGNOSIS — K529 Noninfective gastroenteritis and colitis, unspecified: Secondary | ICD-10-CM

## 2023-07-22 DIAGNOSIS — S92214A Nondisplaced fracture of cuboid bone of right foot, initial encounter for closed fracture: Secondary | ICD-10-CM | POA: Diagnosis not present

## 2023-07-22 DIAGNOSIS — S93621A Sprain of tarsometatarsal ligament of right foot, initial encounter: Secondary | ICD-10-CM | POA: Diagnosis not present

## 2023-08-03 DIAGNOSIS — M62838 Other muscle spasm: Secondary | ICD-10-CM | POA: Diagnosis not present

## 2023-08-03 DIAGNOSIS — S93621A Sprain of tarsometatarsal ligament of right foot, initial encounter: Secondary | ICD-10-CM | POA: Diagnosis not present

## 2023-08-03 DIAGNOSIS — S92214A Nondisplaced fracture of cuboid bone of right foot, initial encounter for closed fracture: Secondary | ICD-10-CM | POA: Diagnosis not present

## 2023-08-03 DIAGNOSIS — C50412 Malignant neoplasm of upper-outer quadrant of left female breast: Secondary | ICD-10-CM | POA: Diagnosis not present

## 2023-08-03 DIAGNOSIS — L905 Scar conditions and fibrosis of skin: Secondary | ICD-10-CM | POA: Diagnosis not present

## 2023-08-03 DIAGNOSIS — Z170 Estrogen receptor positive status [ER+]: Secondary | ICD-10-CM | POA: Diagnosis not present

## 2023-08-10 ENCOUNTER — Other Ambulatory Visit: Payer: Self-pay

## 2023-08-13 ENCOUNTER — Encounter: Payer: Self-pay | Admitting: Gastroenterology

## 2023-08-13 ENCOUNTER — Telehealth: Payer: Self-pay

## 2023-08-13 ENCOUNTER — Ambulatory Visit: Payer: BC Managed Care – PPO | Admitting: Gastroenterology

## 2023-08-13 VITALS — BP 127/81 | HR 76 | Temp 98.2°F | Ht 63.0 in | Wt 145.0 lb

## 2023-08-13 DIAGNOSIS — K200 Eosinophilic esophagitis: Secondary | ICD-10-CM

## 2023-08-13 DIAGNOSIS — K529 Noninfective gastroenteritis and colitis, unspecified: Secondary | ICD-10-CM

## 2023-08-13 MED ORDER — OMEPRAZOLE 40 MG PO CPDR
40.0000 mg | DELAYED_RELEASE_CAPSULE | Freq: Two times a day (BID) | ORAL | 2 refills | Status: DC
Start: 2023-08-13 — End: 2023-12-21

## 2023-08-13 NOTE — Addendum Note (Signed)
Addended by: Lannette Donath R on: 08/13/2023 02:10 PM   Modules accepted: Orders

## 2023-08-13 NOTE — Telephone Encounter (Signed)
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 Faxed form to [phone]

## 2023-08-13 NOTE — Progress Notes (Signed)
[name], MD [address]  [address]  [place], [place] [address]  Main: [phone]  Fax: [phone]    Gastroenterology Consultation  Referring Provider:     Jacky Kindle, FNP Primary Care Physician:  Jacky Kindle, FNP Primary Gastroenterologist:  Dr. [name] Reason for Consultation: Eosinophilic esophagitis, chronic diarrhea        HPI:   Amy Johnston is a 42 y.o. female referred by Jacky Kindle, FNP  for consultation & management of chronic diarrhea.  Patient states that over 6 months ago, she had initial onset of several episodes of watery bowel movements during the day, worse postprandial, denies any rectal bleeding or nocturnal diarrhea.  She denies any any abdominal bloating, nausea or vomiting, weight loss.  Experiences about 5-6 episodes daily, last about 5 to 6 days a week.  Her last episode was about 3 weeks ago.  Currently having formed bowel movement daily or every other day.  She had celiac disease panel which was negative, H. pylori breath test negative, thyroid profile normal, vitamin D, CBC, CMP normal.  She is a breast cancer survivor.  She denies any significant stress in her life.  She works from home.  Does not smoke or drink alcohol.  Endorses occasional CBD gummy usage  She also states being diagnosed with eosinophilic esophagitis about 4 to 5 years ago, currently having symptoms of difficulty swallowing.  She was originally treated with PPI and was also recommended to follow 6 food food elimination diet which she felt was cumbersome.  Brother and daughter has celiac disease Son has eosinophilic esophagitis Grandmother has Crohn's disease  NSAIDs: None  Antiplts/Anticoagulants/Anti thrombotics: None  GI Procedures: [place], diagnosed with eosinophilic esophagitis  Past Medical History:  Diagnosis Date   Allergy    Anxiety    Cancer (HCC)    Depression    Hypertension     Osteoporosis     Past Surgical History:  Procedure Laterality Date   ABDOMINAL HYSTERECTOMY     BREAST SURGERY     EYE SURGERY       Current Outpatient Medications:    Cholecalciferol (VITAMIN D3 PO), Take by mouth., Disp: , Rfl:    letrozole (FEMARA) 2.5 MG tablet, Take by mouth., Disp: , Rfl:    lisinopril (ZESTRIL) 10 MG tablet, Take 2 tablets (20 mg total) by mouth daily., Disp: 180 tablet, Rfl: 1   montelukast (SINGULAIR) 10 MG tablet, TAKE 1 TABLET BY MOUTH EVERYDAY AT BEDTIME, Disp: 90 tablet, Rfl: 1   ondansetron (ZOFRAN) 4 MG tablet, Take 4 mg by mouth every 8 (eight) hours as needed for refractory nausea / vomiting., Disp: , Rfl:    sertraline (ZOLOFT) 100 MG tablet, Take 1 tablet (100 mg total) by mouth daily., Disp: 90 tablet, Rfl: 1   zoledronic acid (ZOMETA) 4 MG/5ML injection, Inject into the vein., Disp: , Rfl:    Family History  Problem Relation Age of Onset   Healthy Mother    Healthy Father    Healthy Brother    Celiac disease Daughter      Social History   Tobacco Use   Smoking status: Never   Smokeless tobacco: Never  Vaping Use   Vaping status: Never Used  Substance Use Topics   Alcohol use: Never   Drug use: Never    Allergies as of 08/13/2023 - Review Complete 08/13/2023  Allergen Reaction Noted   Codeine  12/20/2013    Review of Systems:    All systems reviewed and negative except where noted in HPI.   Physical Exam:  BP 127/81 (BP Location: Left Arm, Patient Position: Sitting, Cuff Size: Normal)   Pulse 76   Temp 98.2 F (36.8 C) (Oral)   Ht 5\' 3"  (1.6 m)   Wt 145 lb (65.8 kg)   BMI 25.69 kg/m  No LMP recorded. Patient has had a hysterectomy.  General:   Alert,  Well-developed, well-nourished, pleasant and cooperative in NAD Head:  Normocephalic and atraumatic. Eyes:  Sclera clear, no icterus.   Conjunctiva pink. Ears:  Normal auditory acuity. Nose:  No deformity, discharge, or lesions. Mouth:  No deformity or  lesions,oropharynx pink & moist. Neck:  Supple; no masses or thyromegaly. Lungs:  Respirations even and unlabored.  Clear throughout to auscultation.   No wheezes, crackles, or rhonchi. No acute distress. Heart:  Regular rate and rhythm; no murmurs, clicks, rubs, or gallops. Abdomen:  Normal bowel sounds. Soft, non-tender and non-distended without masses, hepatosplenomegaly or hernias noted.  No guarding or rebound tenderness.   Rectal: Not performed Msk: Wearing Unna boot in her right leg due to fracture in her right foot Pulses:  Normal pulses noted. Extremities:  No clubbing or edema.  No cyanosis. Neurologic:  Alert and oriented x3;  grossly normal neurologically. Skin:  Intact without significant lesions or rashes. No jaundice. Psych:  Alert and cooperative. Normal mood and affect.  Imaging Studies: Reviewed  Assessment and Plan:   Amy Johnston is a 42 y.o. female with history of breast cancer, in remission, eosinophilic esophagitis currently not on any treatment is seen in consultation for approximately 6 months history of intermittent episodes of chronic nonbloody watery diarrhea without any other constitutional signs or symptoms.  She also has difficulty swallowing  Chronic nonbloody diarrhea Given her family history, discussed with patient regarding upper endoscopy and colonoscopy with possible TI evaluation and colon biopsies.  However, she would like to find out with her insurance company about out-of-pocket expenses before scheduling the procedures.  She will call our office or message as on MyChart once she finds out with her insurance company Recommend stool studies, GI profile PCR, calprotectin and fecal elastase levels when she develops another flareup.  Patient will communicate via MyChart  Eosinophilic esophagitis, diagnosed approximately 5 years ago Treated with PPI Discussed about various treatment options for eosinophilic esophagitis including proton pump inhibitor,  budesonide suspension, Dupixent Patient is interested to try Dupixent, will submit paperwork to her insurance Given her active symptoms, recommend to start omeprazole 40 mg twice daily before meals  Follow up in 3 to 4 months, contact via MyChart as needed   [name], MD

## 2023-08-17 ENCOUNTER — Telehealth: Payer: Self-pay

## 2023-08-17 NOTE — Telephone Encounter (Signed)
Tried to submit the PA through cover my meds and patient insurance states Prior Authorization Not Required

## 2023-08-18 ENCOUNTER — Telehealth: Payer: Self-pay

## 2023-08-18 MED ORDER — DUPILUMAB 300 MG/2ML ~~LOC~~ SOAJ: 300.0000 mg | SUBCUTANEOUS | 12 refills | Status: DC

## 2023-08-18 NOTE — Telephone Encounter (Signed)
Received the My way Dupixent back by fax and tried to do PA on cover my meds and the fax said we needed to send the medication to [facility] speciality pharmacy. She is eligable for a copay card and the copay card number is [id number] Sent medication to them will follow up with them tomorrow

## 2023-08-18 NOTE — Telephone Encounter (Signed)
Received a message from [employer] with the Key to do the PA through cover my meds did the PA through cover my meds and insurance came back Prior Authorization Not Required. [facility] speciality pharmacy and insurance is require a PA for the prescription to go through her insurance. She said it will not go through the patient insurance  Informed them I would call patient insurance company and find out what going on. Called the pharmacy help desk number and talk to the PA department. They said we had to go directly through the plan itself and the number is [phone] option 3, 3. Called and they said they are going to have to fax [place] a form to fill out and fax back to them for them to review. They said they do not do do PA over the phone. They are faxing the form now.

## 2023-08-18 NOTE — Telephone Encounter (Signed)
Received faxed and filled out form and faxed to [facility]. Received confirmation fax that fax went through.

## 2023-08-19 NOTE — Telephone Encounter (Signed)
[employer] denied the Dupxient they said the request does not meet definition  of medical necessity. The patient has not tried a swallowed inhaled steroid medication such as Budesonide or Fluticasone.   If appeal wants to be done then fax appeal to [phone]

## 2023-08-19 NOTE — Telephone Encounter (Signed)
Let's try Suspension (?ohili?): Oral: 2 mg twice daily for 12 weeks.   RV

## 2023-08-20 ENCOUNTER — Other Ambulatory Visit: Payer: Self-pay | Admitting: Gastroenterology

## 2023-08-20 ENCOUNTER — Telehealth: Payer: Self-pay

## 2023-08-20 DIAGNOSIS — Z170 Estrogen receptor positive status [ER+]: Secondary | ICD-10-CM | POA: Diagnosis not present

## 2023-08-20 DIAGNOSIS — C50412 Malignant neoplasm of upper-outer quadrant of left female breast: Secondary | ICD-10-CM | POA: Diagnosis not present

## 2023-08-20 MED ORDER — EOHILIA 2 MG/10ML PO SUSP: 2.0000 mg | Freq: Two times a day (BID) | ORAL | 2 refills | Status: DC

## 2023-08-20 NOTE — Addendum Note (Signed)
Addended by: Radene Knee L on: 08/20/2023 09:08 AM   Modules accepted: Orders

## 2023-08-20 NOTE — Telephone Encounter (Signed)
Sent medication to the pharmacy. Called patient and patient verbalized understanding of instructions. She is okay with medication but wrote down the name because she wants to look up the medication.

## 2023-08-20 NOTE — Telephone Encounter (Signed)
Submitted PA through cover meds for medication Eohilia 2mg . Waiting on response from insurance company

## 2023-08-20 NOTE — Telephone Encounter (Signed)
Has been approved through insurance 08/20/2023 to 08/19/2024. Called pharmacy they will have to order the medication and will be ready tomorrow. She states copay is 45 dollars. Did sign patient up for a copay card that should bring down to 0 dollars. Called patient and left a detail message

## 2023-08-25 ENCOUNTER — Ambulatory Visit: Payer: BC Managed Care – PPO | Admitting: Family Medicine

## 2023-08-25 ENCOUNTER — Encounter: Payer: Self-pay | Admitting: Family Medicine

## 2023-08-25 VITALS — BP 115/75 | HR 75 | Ht 63.0 in | Wt 149.0 lb

## 2023-08-25 DIAGNOSIS — Z1211 Encounter for screening for malignant neoplasm of colon: Secondary | ICD-10-CM

## 2023-08-25 DIAGNOSIS — J4540 Moderate persistent asthma, uncomplicated: Secondary | ICD-10-CM

## 2023-08-25 DIAGNOSIS — S92901D Unspecified fracture of right foot, subsequent encounter for fracture with routine healing: Secondary | ICD-10-CM

## 2023-08-25 DIAGNOSIS — E7800 Pure hypercholesterolemia, unspecified: Secondary | ICD-10-CM | POA: Diagnosis not present

## 2023-08-25 DIAGNOSIS — I10 Essential (primary) hypertension: Secondary | ICD-10-CM

## 2023-08-25 DIAGNOSIS — E559 Vitamin D deficiency, unspecified: Secondary | ICD-10-CM

## 2023-08-25 DIAGNOSIS — F39 Unspecified mood [affective] disorder: Secondary | ICD-10-CM | POA: Diagnosis not present

## 2023-08-25 DIAGNOSIS — S92901S Unspecified fracture of right foot, sequela: Secondary | ICD-10-CM | POA: Insufficient documentation

## 2023-08-25 DIAGNOSIS — F329 Major depressive disorder, single episode, unspecified: Secondary | ICD-10-CM | POA: Diagnosis not present

## 2023-08-25 DIAGNOSIS — B351 Tinea unguium: Secondary | ICD-10-CM

## 2023-08-25 DIAGNOSIS — R7309 Other abnormal glucose: Secondary | ICD-10-CM

## 2023-08-25 DIAGNOSIS — E782 Mixed hyperlipidemia: Secondary | ICD-10-CM | POA: Insufficient documentation

## 2023-08-25 MED ORDER — LISINOPRIL 10 MG PO TABS: 20.0000 mg | ORAL_TABLET | Freq: Every day | ORAL | 3 refills | Status: DC

## 2023-08-25 MED ORDER — SERTRALINE HCL 100 MG PO TABS: 100.0000 mg | ORAL_TABLET | Freq: Every day | ORAL | 3 refills | Status: DC

## 2023-08-25 MED ORDER — TERBINAFINE HCL 250 MG PO TABS: 250.0000 mg | ORAL_TABLET | Freq: Every day | ORAL | 1 refills | Status: DC

## 2023-08-25 MED ORDER — MONTELUKAST SODIUM 10 MG PO TABS: 10.0000 mg | ORAL_TABLET | Freq: Every day | ORAL | 3 refills | Status: DC

## 2023-08-25 NOTE — Assessment & Plan Note (Signed)
Acute on chronic, untreated Request to start oral antifungals to assist Repeat CMP at this time

## 2023-08-25 NOTE — Assessment & Plan Note (Signed)
Chronic, stable Continue zoloft at 100 mg daily F/u as needed    08/25/2023    9:33 AM 03/31/2023    8:35 AM 10/27/2022    8:52 AM  PHQ9 SCORE ONLY  PHQ-9 Total Score 6 0 4      08/25/2023    9:34 AM 04/02/2022    4:07 PM  GAD 7 : Generalized Anxiety Score  Nervous, Anxious, on Edge 0 0  Control/stop worrying 1 0  Worry too much - different things 0 1  Trouble relaxing 1 0  Restless 1 0  Easily annoyed or irritable 0 0  Afraid - awful might happen 0 0  Total GAD 7 Score 3 1  Anxiety Difficulty Not difficult at all Not difficult at all

## 2023-08-25 NOTE — Assessment & Plan Note (Signed)
Chronic, stable Request for refills of singulair

## 2023-08-25 NOTE — Assessment & Plan Note (Signed)
R foot remains on foot; f/b ortho

## 2023-08-25 NOTE — Assessment & Plan Note (Signed)
Chronic, previous elevation noted Repeat Lpa and LP recommend diet low in saturated fat and regular exercise - 30 min at least 5 times per week The 10-year ASCVD risk score (Arnett DK, et al., 2019) is: 1%

## 2023-08-25 NOTE — Progress Notes (Signed)
Established patient visit   Patient: Amy Johnston   DOB: May 25, 1981   42 y.o. Female  MRN: [id number] Visit Date: 08/25/2023  Today's healthcare provider: Jacky Kindle, FNP  Re Introduced to nurse practitioner role and practice setting.  All questions answered.  Discussed provider/patient relationship and expectations.  Chief Complaint  Patient presents with   Foot Injury    Pt stated--went to Urgent care-1 month--prescribe booth for right foot. --still painful especially at night w/o the booth.   Subjective    Foot Injury   Ongoing care at ortho for R foot fracture  Complaints of bilateral toe nail fungus  HPI     Foot Injury    Additional comments: Pt stated--went to Urgent care-1 month--prescribe booth for right foot. --still painful especially at night w/o the booth.      Last edited by Shelly Bombard, CMA on 08/25/2023  9:29 AM.      Medications: Outpatient Medications Prior to Visit  Medication Sig   acetaminophen-codeine (TYLENOL #3) 300-30 MG tablet Take 1 tablet by mouth every 8 (eight) hours as needed.   Cholecalciferol (VITAMIN D3 PO) Take by mouth.   Dupilumab 300 MG/2ML SOAJ Inject 300 mg into the skin once a week.   gabapentin (NEURONTIN) 100 MG capsule Take 100-300 mg by mouth at bedtime.   letrozole (FEMARA) 2.5 MG tablet Take by mouth.   ondansetron (ZOFRAN) 4 MG tablet Take 4 mg by mouth every 8 (eight) hours as needed for refractory nausea / vomiting.   zoledronic acid (ZOMETA) 4 MG/5ML injection Inject into the vein.   [DISCONTINUED] lisinopril (ZESTRIL) 10 MG tablet Take 2 tablets (20 mg total) by mouth daily.   [DISCONTINUED] montelukast (SINGULAIR) 10 MG tablet TAKE 1 TABLET BY MOUTH EVERYDAY AT BEDTIME   [DISCONTINUED] sertraline (ZOLOFT) 100 MG tablet Take 1 tablet (100 mg total) by mouth daily.   Budesonide (EOHILIA) 2 MG/10ML SUSP Take 2 mg by mouth in the morning and at bedtime. (Patient not taking: Reported on 08/25/2023)   omeprazole  (PRILOSEC) 40 MG capsule Take 1 capsule (40 mg total) by mouth 2 (two) times daily before a meal. (Patient not taking: Reported on 08/25/2023)   No facility-administered medications prior to visit.   Last CBC Lab Results  Component Value Date   WBC 7.4 03/31/2023   HGB 13.0 03/31/2023   HCT 38.2 03/31/2023   MCV 91 03/31/2023   MCH 31.1 03/31/2023   RDW 12.5 03/31/2023   PLT 266 03/31/2023   Last metabolic panel Lab Results  Component Value Date   GLUCOSE 99 03/31/2023   NA 141 03/31/2023   K 4.3 03/31/2023   CL 104 03/31/2023   CO2 20 03/31/2023   BUN 12 03/31/2023   CREATININE 0.72 03/31/2023   EGFR 106 03/31/2023   CALCIUM 9.9 03/31/2023   PROT 7.7 03/31/2023   ALBUMIN 5.1 (H) 03/31/2023   LABGLOB 2.6 03/31/2023   AGRATIO 1.8 04/02/2022   BILITOT 0.4 03/31/2023   ALKPHOS 79 03/31/2023   AST 20 03/31/2023   ALT 13 03/31/2023   Last lipids Lab Results  Component Value Date   CHOL 241 (H) 03/31/2023   HDL 59 03/31/2023   LDLCALC 159 (H) 03/31/2023   TRIG 131 03/31/2023   CHOLHDL 4.1 03/31/2023   Last hemoglobin A1c Lab Results  Component Value Date   HGBA1C 5.5 03/31/2023   Last thyroid functions Lab Results  Component Value Date   TSH 1.840 03/31/2023  Objective    BP 115/75 (BP Location: Right Arm, Patient Position: Sitting, Cuff Size: Normal)   Pulse 75   Ht 5\' 3"  (1.6 m)   Wt 149 lb (67.6 kg)   SpO2 97%   BMI 26.39 kg/m   BP Readings from Last 3 Encounters:  08/25/23 115/75  08/13/23 127/81  03/31/23 128/82   Wt Readings from Last 3 Encounters:  08/25/23 149 lb (67.6 kg)  08/13/23 145 lb (65.8 kg)  03/31/23 151 lb 11.2 oz (68.8 kg)   SpO2 Readings from Last 3 Encounters:  08/25/23 97%  10/27/22 99%  10/19/22 96%   Physical Exam Vitals and nursing note reviewed.  Constitutional:      General: She is not in acute distress.    Appearance: Normal appearance. She is overweight. She is not ill-appearing, toxic-appearing or  diaphoretic.  HENT:     Head: Normocephalic and atraumatic.  Cardiovascular:     Rate and Rhythm: Normal rate and regular rhythm.     Pulses: Normal pulses.     Heart sounds: Normal heart sounds. No murmur heard.    No friction rub. No gallop.  Pulmonary:     Effort: Pulmonary effort is normal. No respiratory distress.     Breath sounds: Normal breath sounds. No stridor. No wheezing, rhonchi or rales.  Chest:     Chest wall: No tenderness.  Abdominal:     General: Bowel sounds are normal.     Palpations: Abdomen is soft.  Musculoskeletal:        General: No swelling, tenderness, deformity or signs of injury. Normal range of motion.     Right lower leg: No edema.     Left lower leg: No edema.  Feet:     Comments: Nail fungus present R foot remains in boot s/p ortho injury  Skin:    General: Skin is warm and dry.     Capillary Refill: Capillary refill takes less than 2 seconds.     Coloration: Skin is not jaundiced or pale.     Findings: No bruising, erythema, lesion or rash.  Neurological:     General: No focal deficit present.     Mental Status: She is alert and oriented to person, place, and time. Mental status is at baseline.     Cranial Nerves: No cranial nerve deficit.     Sensory: No sensory deficit.     Motor: No weakness.     Coordination: Coordination normal.  Psychiatric:        Mood and Affect: Mood normal.        Behavior: Behavior normal.        Thought Content: Thought content normal.        Judgment: Judgment normal.     No results found for any visits on 08/25/23.  Assessment & Plan     Problem List Items Addressed This Visit       Cardiovascular and Mediastinum   Essential hypertension    Chronic, at goal Continue lisinopril at previous dosing of 2-10 mg tablets daily Repeat CBC, CMP, TSH Goal remains 119/79      Relevant Medications   lisinopril (ZESTRIL) 10 MG tablet   Other Relevant Orders   CBC with Differential/Platelet   TSH    Comprehensive Metabolic Panel (CMET)     Respiratory   Moderate persistent asthma without complication    Chronic, stable Request for refills of singulair       Relevant Medications   montelukast (SINGULAIR) 10  MG tablet     Musculoskeletal and Integument   Foot fracture, right, sequela    R foot remains on foot; f/b ortho       Relevant Medications   acetaminophen-codeine (TYLENOL #3) 300-30 MG tablet   gabapentin (NEURONTIN) 100 MG capsule   Nail fungus - Primary    Acute on chronic, untreated Request to start oral antifungals to assist Repeat CMP at this time       Relevant Medications   terbinafine (LAMISIL) 250 MG tablet   Other Relevant Orders   Comprehensive Metabolic Panel (CMET)     Other   Impaired glucose regulation    Body mass index is 26.39 kg/m. Discussed importance of healthy weight management Discussed diet and exercise Continue to recommend balanced, lower carb meals. Smaller meal size, adding snacks. Choosing water as drink of choice and increasing purposeful exercise. Check A1c       Relevant Orders   Hemoglobin A1c   Mixed hyperlipidemia    Chronic, previous elevation noted Repeat Lpa and LP recommend diet low in saturated fat and regular exercise - 30 min at least 5 times per week The 10-year ASCVD risk score (Arnett DK, et al., 2019) is: 1%       Relevant Medications   lisinopril (ZESTRIL) 10 MG tablet   Other Relevant Orders   Lipoprotein A (LPA)   Lipid panel   Mood disorder (HCC)    Chronic, stable Continue zoloft at 100 mg daily F/u as needed    08/25/2023    9:33 AM 03/31/2023    8:35 AM 10/27/2022    8:52 AM  PHQ9 SCORE ONLY  PHQ-9 Total Score 6 0 4      08/25/2023    9:34 AM 04/02/2022    4:07 PM  GAD 7 : Generalized Anxiety Score  Nervous, Anxious, on Edge 0 0  Control/stop worrying 1 0  Worry too much - different things 0 1  Trouble relaxing 1 0  Restless 1 0  Easily annoyed or irritable 0 0  Afraid - awful might  happen 0 0  Total GAD 7 Score 3 1  Anxiety Difficulty Not difficult at all Not difficult at all          Relevant Medications   sertraline (ZOLOFT) 100 MG tablet   Screen for colon cancer    Plan for traditional colon cancer screening after 12/19/2023 (43th birthday)      Relevant Orders   Ambulatory referral to Gastroenterology   Vitamin D deficiency    Chronic, on OTC supplement Repeat labs      Relevant Orders   Vitamin D (25 hydroxy)   Return if symptoms worsen or fail to improve.     Leilani Merl, FNP, have reviewed all documentation for this visit. The documentation on 08/25/23 for the exam, diagnosis, procedures, and orders are all accurate and complete.  Jacky Kindle, FNP  [facility] [phone] (phone) [phone] (fax)  [facility]

## 2023-08-25 NOTE — Assessment & Plan Note (Signed)
Chronic, on OTC supplement Repeat labs

## 2023-08-25 NOTE — Assessment & Plan Note (Signed)
Body mass index is 26.39 kg/m. Discussed importance of healthy weight management Discussed diet and exercise Continue to recommend balanced, lower carb meals. Smaller meal size, adding snacks. Choosing water as drink of choice and increasing purposeful exercise. Check A1c

## 2023-08-25 NOTE — Assessment & Plan Note (Signed)
Chronic, at goal Continue lisinopril at previous dosing of 2-10 mg tablets daily Repeat CBC, CMP, TSH Goal remains 119/79

## 2023-08-25 NOTE — Assessment & Plan Note (Signed)
Plan for traditional colon cancer screening after [birth year]/19[birth year]2023 (43th birthday)

## 2023-08-26 LAB — CBC WITH DIFFERENTIAL/PLATELET
Basophils Absolute: 0.1 10*3/uL (ref 0.0–0.2)
Basos: 1 %
EOS (ABSOLUTE): 0.4 10*3/uL (ref 0.0–0.4)
Eos: 5 %
Hematocrit: 35.3 % (ref 34.0–46.6)
Hemoglobin: 11.8 g/dL (ref 11.1–15.9)
Immature Grans (Abs): 0 10*3/uL (ref 0.0–0.1)
Immature Granulocytes: 0 %
Lymphocytes Absolute: 1.5 10*3/uL (ref 0.7–3.1)
Lymphs: 21 %
MCH: 31.3 pg (ref 26.6–33.0)
MCHC: 33.4 g/dL (ref 31.5–35.7)
MCV: 94 fL (ref 79–97)
Monocytes Absolute: 0.5 10*3/uL (ref 0.1–0.9)
Monocytes: 7 %
Neutrophils Absolute: 4.7 10*3/uL (ref 1.4–7.0)
Neutrophils: 66 %
Platelets: 236 10*3/uL (ref 150–450)
RBC: 3.77 x10E6/uL (ref 3.77–5.28)
RDW: 12.5 % (ref 11.7–15.4)
WBC: 7.1 10*3/uL (ref 3.4–10.8)

## 2023-08-26 LAB — LIPOPROTEIN A (LPA): Lipoprotein (a): 49.3 nmol/L (ref <75.0)

## 2023-08-26 LAB — LIPID PANEL
Chol/HDL Ratio: 3.1 {ratio} (ref 0.0–4.4)
Cholesterol, Total: 184 mg/dL (ref 100–199)
HDL: 59 mg/dL (ref >39)
LDL Chol Calc (NIH): 108 mg/dL — ABNORMAL HIGH (ref 0–99)
Triglycerides: 91 mg/dL (ref 0–149)
VLDL Cholesterol Cal: 17 mg/dL (ref 5–40)

## 2023-08-26 LAB — COMPREHENSIVE METABOLIC PANEL
ALT: 9 [IU]/L (ref 0–32)
AST: 15 [IU]/L (ref 0–40)
Albumin: 4.5 g/dL (ref 3.9–4.9)
Alkaline Phosphatase: 65 [IU]/L (ref 44–121)
BUN/Creatinine Ratio: 13 (ref 9–23)
BUN: 11 mg/dL (ref 6–24)
Bilirubin Total: 0.3 mg/dL (ref 0.0–1.2)
CO2: 21 mmol/L (ref 20–29)
Calcium: 9.5 mg/dL (ref 8.7–10.2)
Chloride: 104 mmol/L (ref 96–106)
Creatinine, Ser: 0.83 mg/dL (ref 0.57–1.00)
Globulin, Total: 2.7 g/dL (ref 1.5–4.5)
Glucose: 92 mg/dL (ref 70–99)
Potassium: 4.8 mmol/L (ref 3.5–5.2)
Sodium: 141 mmol/L (ref 134–144)
Total Protein: 7.2 g/dL (ref 6.0–8.5)
eGFR: 89 mL/min/{1.73_m2} (ref >59)

## 2023-08-26 LAB — HEMOGLOBIN A1C
Est. average glucose Bld gHb Est-mCnc: 114 mg/dL
Hgb A1c MFr Bld: 5.6 % (ref 4.8–5.6)

## 2023-08-26 LAB — VITAMIN D 25 HYDROXY (VIT D DEFICIENCY, FRACTURES): Vit D, 25-Hydroxy: 44.4 ng/mL (ref 30.0–100.0)

## 2023-08-26 LAB — TSH: TSH: 1.04 u[IU]/mL (ref 0.450–4.500)

## 2023-10-28 ENCOUNTER — Ambulatory Visit: Payer: BC Managed Care – PPO | Admitting: Physician Assistant

## 2023-10-29 DIAGNOSIS — R918 Other nonspecific abnormal finding of lung field: Secondary | ICD-10-CM | POA: Diagnosis not present

## 2023-10-29 DIAGNOSIS — I10 Essential (primary) hypertension: Secondary | ICD-10-CM | POA: Diagnosis not present

## 2023-10-29 DIAGNOSIS — Z170 Estrogen receptor positive status [ER+]: Secondary | ICD-10-CM | POA: Diagnosis not present

## 2023-10-29 DIAGNOSIS — Z79811 Long term (current) use of aromatase inhibitors: Secondary | ICD-10-CM | POA: Diagnosis not present

## 2023-10-29 DIAGNOSIS — C50412 Malignant neoplasm of upper-outer quadrant of left female breast: Secondary | ICD-10-CM | POA: Diagnosis not present

## 2023-10-29 DIAGNOSIS — Z87891 Personal history of nicotine dependence: Secondary | ICD-10-CM | POA: Diagnosis not present

## 2023-10-29 DIAGNOSIS — F419 Anxiety disorder, unspecified: Secondary | ICD-10-CM | POA: Diagnosis not present

## 2023-10-29 DIAGNOSIS — Z79899 Other long term (current) drug therapy: Secondary | ICD-10-CM | POA: Diagnosis not present

## 2023-12-08 DIAGNOSIS — M79671 Pain in right foot: Secondary | ICD-10-CM | POA: Diagnosis not present

## 2023-12-08 DIAGNOSIS — S92211S Displaced fracture of cuboid bone of right foot, sequela: Secondary | ICD-10-CM | POA: Diagnosis not present

## 2023-12-08 DIAGNOSIS — M7671 Peroneal tendinitis, right leg: Secondary | ICD-10-CM | POA: Diagnosis not present

## 2023-12-08 DIAGNOSIS — M25571 Pain in right ankle and joints of right foot: Secondary | ICD-10-CM | POA: Diagnosis not present

## 2023-12-12 DIAGNOSIS — M65871 Other synovitis and tenosynovitis, right ankle and foot: Secondary | ICD-10-CM | POA: Diagnosis not present

## 2023-12-17 DIAGNOSIS — M25371 Other instability, right ankle: Secondary | ICD-10-CM | POA: Diagnosis not present

## 2023-12-17 DIAGNOSIS — M7671 Peroneal tendinitis, right leg: Secondary | ICD-10-CM | POA: Diagnosis not present

## 2023-12-17 DIAGNOSIS — M65871 Other synovitis and tenosynovitis, right ankle and foot: Secondary | ICD-10-CM | POA: Diagnosis not present

## 2023-12-18 ENCOUNTER — Telehealth: Payer: Self-pay | Admitting: Gastroenterology

## 2023-12-18 NOTE — Telephone Encounter (Signed)
 The patient called to check the status of her appointment, as she had two different appointment dates and wanted to clarify the correct one. I informed her that her appointment is scheduled for 12/21/23 at 1:00 PM in [place]. I also confirmed that she previously had an appointment on 12/23/23, but it was canceled.

## 2023-12-21 ENCOUNTER — Other Ambulatory Visit: Payer: Self-pay

## 2023-12-21 ENCOUNTER — Ambulatory Visit: Payer: BC Managed Care – PPO | Admitting: Gastroenterology

## 2023-12-21 ENCOUNTER — Encounter: Payer: Self-pay | Admitting: Gastroenterology

## 2023-12-21 VITALS — BP 137/89 | HR 88 | Temp 97.8°F | Ht 63.0 in | Wt 154.5 lb

## 2023-12-21 DIAGNOSIS — K200 Eosinophilic esophagitis: Secondary | ICD-10-CM | POA: Diagnosis not present

## 2023-12-21 DIAGNOSIS — Z1211 Encounter for screening for malignant neoplasm of colon: Secondary | ICD-10-CM

## 2023-12-21 MED ORDER — NA SULFATE-K SULFATE-MG SULF 17.5-3.13-1.6 GM/177ML PO SOLN: 354.0000 mL | Freq: Once | ORAL | 0 refills | Status: AC

## 2023-12-21 NOTE — Progress Notes (Signed)
 Amy Repress, MD [address]  [address]  [place], [place] [address]  Main: [phone]  Fax: [phone]    Gastroenterology Consultation  Referring Provider:     Jacky Kindle, FNP Primary Care Physician:  Amy Kindle, FNP Primary Gastroenterologist:  Dr. Arlyss Johnston Reason for Consultation: Eosinophilic esophagitis, colon cancer screening        HPI:   Amy Johnston is a 43 y.o. female referred by Amy Kindle, FNP  for consultation & management of chronic diarrhea.  Patient states that over 6 months ago, she had initial onset of several episodes of watery bowel movements during the day, worse postprandial, denies any rectal bleeding or nocturnal diarrhea.  She denies any any abdominal bloating, nausea or vomiting, weight loss.  Experiences about 5-6 episodes daily, last about 5 to 6 days a week.  Her last episode was about 3 weeks ago.  Currently having formed bowel movement daily or every other day.  She had celiac disease panel which was negative, H. pylori breath test negative, thyroid profile normal, vitamin D, CBC, CMP normal.  She is a breast cancer survivor.  She denies any significant stress in her life.  She works from home.  Does not smoke or drink alcohol.  Endorses occasional CBD gummy usage  She also states being diagnosed with eosinophilic esophagitis about 4 to 5 years ago, currently having symptoms of difficulty swallowing.  She was originally treated with PPI and was also recommended to follow 6 food food elimination diet which she felt was cumbersome.  Follow-up visit 12/21/2023 His daughter is here for follow-up of eosinophilic esophagitis.  Her insurance denied Dupixent and preferred to try budesonide.  She is currently on a budesonide liquid suspension 2 mg twice daily and denies any upper GI symptoms.  She denies diarrhea.  She fractured her right foot after a fall at home, still wearing [employer] and daughter has celiac disease Son has  eosinophilic esophagitis Grandmother has Crohn's disease  NSAIDs: None  Antiplts/Anticoagulants/Anti thrombotics: None  GI Procedures: EGD [place] years [place] dys[place], diagnosed with eosinophilic esophagitis  Past Medical History:  Diagnosis Date   Allergy    Anxiety    Cancer (HCC)    Depression    Hypertension    Osteoporosis    Syncope and collapse 07/29/2018      -patient with a concerning "collapse" without frank or overt loss of consciousness  - TTE with mild mitral valve prolapse mild MR otherwise structurally normal heart  - Zio Patch did reveal a short burst of nonsustained ventricular tachycardia  - TSH unremarkable potassium and magnesium within normal limits from earlier this month  - overall low risk based on structurally normal heart  - but wi    Past Surgical History:  Procedure Laterality Date   ABDOMINAL HYSTERECTOMY     BREAST SURGERY     EYE SURGERY       Current Outpatient Medications:    Cholecalciferol (VITAMIN D3 PO), Take by mouth., Disp: , Rfl:    letrozole (FEMARA) 2.5 MG tablet, Take by mouth., Disp: , Rfl:    lisinopril (ZESTRIL) 10 MG tablet, Take 2 tablets (20 mg total) by mouth daily., Disp: 180 tablet, Rfl: 3   montelukast (SINGULAIR) 10 MG tablet, Take 1 tablet (10 mg total) by mouth at bedtime., Disp: 90 tablet, Rfl: 3   Na Sulfate-K Sulfate-Mg Sulfate concentrate (SUPREP) 17.5-3.13-1.6 GM/177ML SOLN, Take 1 kit (354  mLs total) by mouth once for 1 dose., Disp: 354 mL, Rfl: 0   ondansetron (ZOFRAN) 4 MG tablet, Take 4 mg by mouth every 8 (eight) hours as needed for refractory nausea / vomiting., Disp: , Rfl:    sertraline (ZOLOFT) 100 MG tablet, Take 1 tablet (100 mg total) by mouth daily., Disp: 90 tablet, Rfl: 3   zoledronic acid (ZOMETA) 4 MG/5ML injection, Inject into the vein., Disp: , Rfl:    Family History  Problem Relation Age of Onset   Healthy Mother    Healthy Father    Healthy Brother    Celiac  disease Daughter      Social History   Tobacco Use   Smoking status: Never   Smokeless tobacco: Never  Vaping Use   Vaping status: Never Used  Substance Use Topics   Alcohol use: Yes    Alcohol/week: 3.0 standard drinks of alcohol    Types: 3 Cans of beer per week   Drug use: Never    Allergies as of 12/21/2023 - Review Complete 12/21/2023  Allergen Reaction Noted   Codeine  12/20/2013    Review of Systems:    All systems reviewed and negative except where noted in HPI.   Physical Exam:  BP 137/89 (BP Location: Right Arm, Patient Position: Sitting, Cuff Size: Normal)   Pulse 88   Temp 97.8 F (36.6 C) (Oral)   Ht 5\' 3"  (1.6 m)   Wt 154 lb 8 oz (70.1 kg)   BMI 27.37 kg/m  No LMP recorded. Patient has had a hysterectomy.  General:   Alert,  Well-developed, well-nourished, pleasant and cooperative in NAD Head:  Normocephalic and atraumatic. Eyes:  Sclera clear, no icterus.   Conjunctiva pink. Ears:  Normal auditory acuity. Nose:  No deformity, discharge, or lesions. Mouth:  No deformity or lesions,oropharynx pink & moist. Neck:  Supple; no masses or thyromegaly. Lungs:  Respirations even and unlabored.  Clear throughout to auscultation.   No wheezes, crackles, or rhonchi. No acute distress. Heart:  Regular rate and rhythm; no murmurs, clicks, rubs, or gallops. Abdomen:  Normal bowel sounds. Soft, non-tender and non-distended without masses, hepatosplenomegaly or hernias noted.  No guarding or rebound tenderness.   Rectal: Not performed Msk: Wearing Unna boot in her right leg due to fracture in her right foot Pulses:  Normal pulses noted. Extremities:  No clubbing or edema.  No cyanosis. Neurologic:  Alert and oriented x3;  grossly normal neurologically. Skin:  Intact without significant lesions or rashes. No jaundice. Psych:  Alert and cooperative. Normal mood and affect.  Imaging Studies: Reviewed  Assessment and Plan:   Amy Johnston is a 43 y.o. female with  history of breast cancer, in remission, eosinophilic esophagitis on budesonide suspension.  Chronic diarrhea resolved  Eosinophilic esophagitis, diagnosed approximately 5 years ago Treated with PPI Continue budesonide suspension 2 mg / 10 mL twice daily. Recommend EGD with proximal and distal esophageal biopsies, if in remission, will decrease the dose to once a day  Colon cancer screening Recommend screening colonoscopy  I have discussed alternative options, risks & benefits,  which include, but are not limited to, bleeding, infection, perforation,respiratory complication & drug reaction.  The patient agrees with this plan & written consent will be obtained.    Follow up based on EGD, contact via MyChart as needed   Amy Repress, MD

## 2023-12-23 ENCOUNTER — Ambulatory Visit: Payer: BC Managed Care – PPO | Admitting: Gastroenterology

## 2023-12-23 ENCOUNTER — Telehealth: Payer: Self-pay

## 2023-12-23 NOTE — Telephone Encounter (Signed)
 Patient procedure is 01/12/2024. Does the Peer to peer have to be done by a certain day? Amy Johnston is on vacation from now till April 10

## 2023-12-23 NOTE — Telephone Encounter (Signed)
 [id number] doesn't meet medical necessity MD must do peer to peer .   [phone] .Marland Kitchen Ref number [id number]   [id number] No pre-authorization is necessary for this service.

## 2023-12-24 NOTE — Telephone Encounter (Signed)
 I was on hold for at least 30 minutes and no one picked up the call.  Could you please arrange peer to peer for me, just let me know the date and time  This patient definitely needs upper endoscopy for follow-up of eosinophilic esophagitis to assess response to her treatment  Thanks RV

## 2023-12-24 NOTE — Telephone Encounter (Signed)
 Call [facility] for Dr.Vanga to do peer to peer.

## 2023-12-29 ENCOUNTER — Telehealth: Payer: Self-pay | Admitting: Gastroenterology

## 2023-12-29 NOTE — Telephone Encounter (Signed)
 Pt requesting call back to reschedule colonoscoy that is scheduled for 01/12/2024

## 2023-12-29 NOTE — Telephone Encounter (Signed)
 Patient is having foot surgery on 01/08/2024. Rescheduled  to 02/02/2024 sent out new instructions, mailed them and sent to [facility]. Called [facility] and talk to kim and she will get patient moved

## 2024-01-05 DIAGNOSIS — Z01818 Encounter for other preprocedural examination: Secondary | ICD-10-CM | POA: Diagnosis not present

## 2024-01-08 DIAGNOSIS — M65871 Other synovitis and tenosynovitis, right ankle and foot: Secondary | ICD-10-CM | POA: Diagnosis not present

## 2024-01-08 DIAGNOSIS — S93491S Sprain of other ligament of right ankle, sequela: Secondary | ICD-10-CM | POA: Diagnosis not present

## 2024-01-08 DIAGNOSIS — M25871 Other specified joint disorders, right ankle and foot: Secondary | ICD-10-CM | POA: Diagnosis not present

## 2024-01-08 DIAGNOSIS — S86311D Strain of muscle(s) and tendon(s) of peroneal muscle group at lower leg level, right leg, subsequent encounter: Secondary | ICD-10-CM | POA: Diagnosis not present

## 2024-01-08 DIAGNOSIS — S86311A Strain of muscle(s) and tendon(s) of peroneal muscle group at lower leg level, right leg, initial encounter: Secondary | ICD-10-CM | POA: Diagnosis not present

## 2024-01-08 DIAGNOSIS — S93401A Sprain of unspecified ligament of right ankle, initial encounter: Secondary | ICD-10-CM | POA: Diagnosis not present

## 2024-01-08 DIAGNOSIS — M67873 Other specified disorders of tendon, right ankle and foot: Secondary | ICD-10-CM | POA: Diagnosis not present

## 2024-01-08 DIAGNOSIS — S93431A Sprain of tibiofibular ligament of right ankle, initial encounter: Secondary | ICD-10-CM | POA: Diagnosis not present

## 2024-01-08 DIAGNOSIS — M7671 Peroneal tendinitis, right leg: Secondary | ICD-10-CM | POA: Diagnosis not present

## 2024-01-08 DIAGNOSIS — M25371 Other instability, right ankle: Secondary | ICD-10-CM | POA: Diagnosis not present

## 2024-01-08 DIAGNOSIS — G8918 Other acute postprocedural pain: Secondary | ICD-10-CM | POA: Diagnosis not present

## 2024-01-15 DIAGNOSIS — M7671 Peroneal tendinitis, right leg: Secondary | ICD-10-CM | POA: Diagnosis not present

## 2024-01-15 DIAGNOSIS — M25371 Other instability, right ankle: Secondary | ICD-10-CM | POA: Diagnosis not present

## 2024-01-22 DIAGNOSIS — S86311A Strain of muscle(s) and tendon(s) of peroneal muscle group at lower leg level, right leg, initial encounter: Secondary | ICD-10-CM | POA: Diagnosis not present

## 2024-01-22 DIAGNOSIS — M7671 Peroneal tendinitis, right leg: Secondary | ICD-10-CM | POA: Diagnosis not present

## 2024-01-22 DIAGNOSIS — M65871 Other synovitis and tenosynovitis, right ankle and foot: Secondary | ICD-10-CM | POA: Diagnosis not present

## 2024-01-22 DIAGNOSIS — M25371 Other instability, right ankle: Secondary | ICD-10-CM | POA: Diagnosis not present

## 2024-01-25 ENCOUNTER — Telehealth: Payer: Self-pay

## 2024-01-25 NOTE — Telephone Encounter (Signed)
 Called and informed patient that Dr. Baldomero Bone last week of doing procedure with us  is 05/19. Gave her the number to [facility] where she can call them to schedule when she is ready to schedule her procedure.

## 2024-01-25 NOTE — Telephone Encounter (Signed)
 Pt left a message stated she is scheduled for a Colonoscopy/EGD next week, May 6th. She has broken her foot and is on crutches. She is requesting a call back to reschedule.

## 2024-01-25 NOTE — Telephone Encounter (Signed)
 Called kim and she will get patient canceled.

## 2024-02-02 ENCOUNTER — Ambulatory Visit: Admission: RE | Admit: 2024-02-02 | Source: Home / Self Care | Admitting: Gastroenterology

## 2024-02-02 ENCOUNTER — Encounter: Admission: RE | Payer: Self-pay | Source: Home / Self Care

## 2024-02-02 SURGERY — COLONOSCOPY
Anesthesia: General

## 2024-02-08 DIAGNOSIS — M25571 Pain in right ankle and joints of right foot: Secondary | ICD-10-CM | POA: Diagnosis not present

## 2024-02-08 DIAGNOSIS — R600 Localized edema: Secondary | ICD-10-CM | POA: Diagnosis not present

## 2024-02-15 DIAGNOSIS — M25571 Pain in right ankle and joints of right foot: Secondary | ICD-10-CM | POA: Diagnosis not present

## 2024-02-15 DIAGNOSIS — R600 Localized edema: Secondary | ICD-10-CM | POA: Diagnosis not present

## 2024-02-19 DIAGNOSIS — M25571 Pain in right ankle and joints of right foot: Secondary | ICD-10-CM | POA: Diagnosis not present

## 2024-02-19 DIAGNOSIS — R600 Localized edema: Secondary | ICD-10-CM | POA: Diagnosis not present

## 2024-02-23 DIAGNOSIS — M25571 Pain in right ankle and joints of right foot: Secondary | ICD-10-CM | POA: Diagnosis not present

## 2024-02-23 DIAGNOSIS — R600 Localized edema: Secondary | ICD-10-CM | POA: Diagnosis not present

## 2024-02-25 DIAGNOSIS — M25571 Pain in right ankle and joints of right foot: Secondary | ICD-10-CM | POA: Diagnosis not present

## 2024-02-25 DIAGNOSIS — R600 Localized edema: Secondary | ICD-10-CM | POA: Diagnosis not present

## 2024-03-01 DIAGNOSIS — R600 Localized edema: Secondary | ICD-10-CM | POA: Diagnosis not present

## 2024-03-01 DIAGNOSIS — M25571 Pain in right ankle and joints of right foot: Secondary | ICD-10-CM | POA: Diagnosis not present

## 2024-03-03 DIAGNOSIS — M25571 Pain in right ankle and joints of right foot: Secondary | ICD-10-CM | POA: Diagnosis not present

## 2024-03-03 DIAGNOSIS — R600 Localized edema: Secondary | ICD-10-CM | POA: Diagnosis not present

## 2024-03-08 DIAGNOSIS — M25571 Pain in right ankle and joints of right foot: Secondary | ICD-10-CM | POA: Diagnosis not present

## 2024-03-08 DIAGNOSIS — R600 Localized edema: Secondary | ICD-10-CM | POA: Diagnosis not present

## 2024-03-10 DIAGNOSIS — R600 Localized edema: Secondary | ICD-10-CM | POA: Diagnosis not present

## 2024-03-10 DIAGNOSIS — M25571 Pain in right ankle and joints of right foot: Secondary | ICD-10-CM | POA: Diagnosis not present

## 2024-03-15 DIAGNOSIS — R600 Localized edema: Secondary | ICD-10-CM | POA: Diagnosis not present

## 2024-03-15 DIAGNOSIS — M25571 Pain in right ankle and joints of right foot: Secondary | ICD-10-CM | POA: Diagnosis not present

## 2024-03-22 DIAGNOSIS — R600 Localized edema: Secondary | ICD-10-CM | POA: Diagnosis not present

## 2024-03-22 DIAGNOSIS — M25571 Pain in right ankle and joints of right foot: Secondary | ICD-10-CM | POA: Diagnosis not present

## 2024-03-24 DIAGNOSIS — M25571 Pain in right ankle and joints of right foot: Secondary | ICD-10-CM | POA: Diagnosis not present

## 2024-03-24 DIAGNOSIS — R600 Localized edema: Secondary | ICD-10-CM | POA: Diagnosis not present

## 2024-03-28 DIAGNOSIS — M25571 Pain in right ankle and joints of right foot: Secondary | ICD-10-CM | POA: Diagnosis not present

## 2024-03-28 DIAGNOSIS — R600 Localized edema: Secondary | ICD-10-CM | POA: Diagnosis not present

## 2024-03-31 DIAGNOSIS — R600 Localized edema: Secondary | ICD-10-CM | POA: Diagnosis not present

## 2024-03-31 DIAGNOSIS — M25571 Pain in right ankle and joints of right foot: Secondary | ICD-10-CM | POA: Diagnosis not present

## 2024-04-05 DIAGNOSIS — R600 Localized edema: Secondary | ICD-10-CM | POA: Diagnosis not present

## 2024-04-05 DIAGNOSIS — M25571 Pain in right ankle and joints of right foot: Secondary | ICD-10-CM | POA: Diagnosis not present

## 2024-04-07 DIAGNOSIS — M25571 Pain in right ankle and joints of right foot: Secondary | ICD-10-CM | POA: Diagnosis not present

## 2024-04-07 DIAGNOSIS — R600 Localized edema: Secondary | ICD-10-CM | POA: Diagnosis not present

## 2024-04-12 DIAGNOSIS — M25571 Pain in right ankle and joints of right foot: Secondary | ICD-10-CM | POA: Diagnosis not present

## 2024-04-12 DIAGNOSIS — R600 Localized edema: Secondary | ICD-10-CM | POA: Diagnosis not present

## 2024-04-14 DIAGNOSIS — R600 Localized edema: Secondary | ICD-10-CM | POA: Diagnosis not present

## 2024-04-14 DIAGNOSIS — M25571 Pain in right ankle and joints of right foot: Secondary | ICD-10-CM | POA: Diagnosis not present

## 2024-04-28 DIAGNOSIS — C50412 Malignant neoplasm of upper-outer quadrant of left female breast: Secondary | ICD-10-CM | POA: Diagnosis not present

## 2024-04-28 DIAGNOSIS — Z170 Estrogen receptor positive status [ER+]: Secondary | ICD-10-CM | POA: Diagnosis not present

## 2024-05-19 DIAGNOSIS — S93411A Sprain of calcaneofibular ligament of right ankle, initial encounter: Secondary | ICD-10-CM | POA: Diagnosis not present

## 2024-05-19 DIAGNOSIS — M7671 Peroneal tendinitis, right leg: Secondary | ICD-10-CM | POA: Diagnosis not present

## 2024-05-19 DIAGNOSIS — S93491A Sprain of other ligament of right ankle, initial encounter: Secondary | ICD-10-CM | POA: Diagnosis not present

## 2024-05-19 DIAGNOSIS — M25371 Other instability, right ankle: Secondary | ICD-10-CM | POA: Diagnosis not present

## 2024-06-08 ENCOUNTER — Telehealth: Payer: Self-pay | Admitting: Family Medicine

## 2024-06-08 ENCOUNTER — Other Ambulatory Visit: Payer: Self-pay

## 2024-06-08 DIAGNOSIS — I10 Essential (primary) hypertension: Secondary | ICD-10-CM

## 2024-06-08 MED ORDER — LISINOPRIL 10 MG PO TABS: ORAL_TABLET | ORAL | 0 refills | Status: DC

## 2024-06-08 NOTE — Telephone Encounter (Signed)
 Converted into a refill request

## 2024-06-08 NOTE — Telephone Encounter (Signed)
[facility] faxed refill request for the following medications:  lisinopril (ZESTRIL) 10 MG tablet   Please advise.  

## 2024-08-11 DIAGNOSIS — S93491S Sprain of other ligament of right ankle, sequela: Secondary | ICD-10-CM | POA: Diagnosis not present

## 2024-08-11 DIAGNOSIS — M24671 Ankylosis, right ankle: Secondary | ICD-10-CM | POA: Diagnosis not present

## 2024-08-11 DIAGNOSIS — M65871 Other synovitis and tenosynovitis, right ankle and foot: Secondary | ICD-10-CM | POA: Diagnosis not present

## 2024-08-11 DIAGNOSIS — S93411S Sprain of calcaneofibular ligament of right ankle, sequela: Secondary | ICD-10-CM | POA: Diagnosis not present

## 2024-08-30 DIAGNOSIS — M79671 Pain in right foot: Secondary | ICD-10-CM | POA: Diagnosis not present

## 2024-08-30 DIAGNOSIS — M65871 Other synovitis and tenosynovitis, right ankle and foot: Secondary | ICD-10-CM | POA: Diagnosis not present

## 2024-09-05 ENCOUNTER — Other Ambulatory Visit: Payer: Self-pay | Admitting: Family Medicine

## 2024-09-05 DIAGNOSIS — I10 Essential (primary) hypertension: Secondary | ICD-10-CM

## 2024-09-05 DIAGNOSIS — M24671 Ankylosis, right ankle: Secondary | ICD-10-CM | POA: Diagnosis not present

## 2024-09-05 DIAGNOSIS — M7671 Peroneal tendinitis, right leg: Secondary | ICD-10-CM | POA: Diagnosis not present

## 2024-09-05 DIAGNOSIS — M65871 Other synovitis and tenosynovitis, right ankle and foot: Secondary | ICD-10-CM | POA: Diagnosis not present

## 2024-09-05 DIAGNOSIS — S86311S Strain of muscle(s) and tendon(s) of peroneal muscle group at lower leg level, right leg, sequela: Secondary | ICD-10-CM | POA: Diagnosis not present

## 2024-09-12 ENCOUNTER — Telehealth: Payer: Self-pay | Admitting: Family Medicine

## 2024-09-12 NOTE — Telephone Encounter (Signed)
 Medication decline. Pt last OV 08/25/2023 with Kelly Cedar. Pt needs to est care with a new PCP.

## 2024-09-12 NOTE — Telephone Encounter (Signed)
[facility] faxed refill request for the following medications: ° °sertraline (ZOLOFT) 100 MG tablet  ° °Please advise. ° °

## 2024-09-30 ENCOUNTER — Encounter: Payer: Self-pay | Admitting: Family Medicine

## 2024-09-30 ENCOUNTER — Ambulatory Visit: Admitting: Family Medicine

## 2024-09-30 VITALS — BP 96/73 | HR 77 | Resp 16 | Ht 63.0 in | Wt 153.7 lb

## 2024-09-30 DIAGNOSIS — E782 Mixed hyperlipidemia: Secondary | ICD-10-CM

## 2024-09-30 DIAGNOSIS — R110 Nausea: Secondary | ICD-10-CM

## 2024-09-30 DIAGNOSIS — K200 Eosinophilic esophagitis: Secondary | ICD-10-CM | POA: Diagnosis not present

## 2024-09-30 DIAGNOSIS — M8580 Other specified disorders of bone density and structure, unspecified site: Secondary | ICD-10-CM | POA: Diagnosis not present

## 2024-09-30 DIAGNOSIS — I10 Essential (primary) hypertension: Secondary | ICD-10-CM | POA: Diagnosis not present

## 2024-09-30 DIAGNOSIS — Z789 Other specified health status: Secondary | ICD-10-CM

## 2024-09-30 DIAGNOSIS — J4540 Moderate persistent asthma, uncomplicated: Secondary | ICD-10-CM

## 2024-09-30 DIAGNOSIS — Z0000 Encounter for general adult medical examination without abnormal findings: Secondary | ICD-10-CM

## 2024-09-30 DIAGNOSIS — Z0001 Encounter for general adult medical examination with abnormal findings: Secondary | ICD-10-CM

## 2024-09-30 DIAGNOSIS — F39 Unspecified mood [affective] disorder: Secondary | ICD-10-CM

## 2024-09-30 DIAGNOSIS — Z23 Encounter for immunization: Secondary | ICD-10-CM | POA: Diagnosis not present

## 2024-09-30 DIAGNOSIS — Z1211 Encounter for screening for malignant neoplasm of colon: Secondary | ICD-10-CM | POA: Diagnosis not present

## 2024-09-30 MED ORDER — ONDANSETRON HCL 4 MG PO TABS: 4.0000 mg | ORAL_TABLET | Freq: Three times a day (TID) | ORAL | 1 refills | Status: AC | PRN

## 2024-09-30 MED ORDER — SERTRALINE HCL 100 MG PO TABS: 100.0000 mg | ORAL_TABLET | Freq: Every day | ORAL | 3 refills | Status: AC

## 2024-09-30 NOTE — Patient Instructions (Signed)
Restart your vitamin D

## 2024-09-30 NOTE — Progress Notes (Signed)
 "   Transition of care patient visit   Patient: Amy Johnston   DOB: Nov 12, 1980   44 y.o. Female  MRN: [id number] Visit Date: 09/30/2024  Today's healthcare provider: LAURAINE LOISE BUOY, DO   Chief Complaint  Patient presents with   Transitions Of Care    Patient needs referral    Subjective    Amy Johnston is a 44 y.o. female who presents today as a new patient to establish care.  HPI HPI     Transitions Of Care    Additional comments: Patient needs referral       Last edited by [name], [name], CMA on 09/30/2024  1:41 PM.       Amy Johnston is a 44 year old female with hypertension and asthma who presents for a transition of care visit and vaccination updates.  She is transitioning care as her previous provider left the practice. She is also here to update her vaccinations, including the pneumonia and HPV vaccines.  She has hypertension and is currently taking lisinopril  10 mg daily. She experienced a recent episode of elevated blood pressure during the holidays, which she managed by temporarily doubling her lisinopril  dose. She is sensitive to blood pressure changes and feels it immediately when her blood pressure is out of range. She also takes sertraline  100 mg daily and requires a refill. She has experienced some dizziness and lightheadedness, which she attributes to blood pressure fluctuations. No chest pain, shortness of breath, or swelling, except for post-surgical swelling in her right ankle.  She has asthma and previously used montelukast  for seasonal allergies, but she no longer takes it regularly as she has a sufficient supply at home.  She has a history of osteopenia and is receiving zoledronic  acid treatment.  She has eosinophilic esophagitis and requires an endoscopy to monitor her condition. She was previously scheduled for this procedure but had to postpone due to foot surgery. She is seeking a new referral for the procedure.  She reports occasional nausea, which she manages  with Zofran , and requests a new prescription. She experiences some neuropathy and tingling, which she attributes to past chemotherapy treatment.  She is uncertain about her hepatitis B vaccination status and is considering testing for immunity. She recalls receiving vaccinations during college but is unsure if they included hepatitis B.    Past Medical History:  Diagnosis Date   Allergy    Anxiety    Cancer (HCC)    Depression    Hypertension    Osteoporosis    Syncope and collapse 07/29/2018      -patient with a concerning collapse without frank or overt loss of consciousness  - TTE with mild mitral valve prolapse mild MR otherwise structurally normal heart  - Zio Patch did reveal a short burst of nonsustained ventricular tachycardia  - TSH unremarkable potassium and magnesium within normal limits from earlier this month  - overall low risk based on structurally normal heart  - but wi   Past Surgical History:  Procedure Laterality Date   ABDOMINAL HYSTERECTOMY     BREAST SURGERY     EYE SURGERY     Family Status  Relation Name Status   Mother  Alive   Father  Alive   Brother  Alive   Daughter  Alive   Daughter  Alive  No partnership data on file   Family History  Problem Relation Age of Onset   Healthy Mother    Healthy Father    Healthy Brother  Celiac disease Daughter    Social History   Socioeconomic History   Marital status: Married    Spouse name: Not on file   Number of children: Not on file   Years of education: Not on file   Highest education level: Not on file  Occupational History   Not on file  Tobacco Use   Smoking status: Never   Smokeless tobacco: Never  Vaping Use   Vaping status: Never Used  Substance and Sexual Activity   Alcohol use: Yes    Alcohol/week: 3.0 standard drinks of alcohol    Types: 3 Cans of beer per week   Drug use: Never   Sexual activity: Not on file  Other Topics Concern   Not on file  Social History Narrative    Not on file   Social Drivers of Health   Tobacco Use: Low Risk (09/30/2024)   Patient History    Smoking Tobacco Use: Never    Smokeless Tobacco Use: Never    Passive Exposure: Not on file  Financial Resource Strain: Not on file  Food Insecurity: Not on file  Transportation Needs: Not on file  Physical Activity: Not on file  Stress: Not on file  Social Connections: Not on file  Depression (PHQ2-9): Low Risk (09/30/2024)   Depression (PHQ2-9)    PHQ-2 Score: 0  Alcohol Screen: Low Risk (10/27/2022)   Alcohol Screen    Last Alcohol Screening Score (AUDIT): 2  Housing: Unknown (10/28/2023)   Received from [facility]   Epic    Unable to Pay for Housing in the Last Year: Not on file    Number of Times Moved in the Last Year: Not on file    At any time in the past 12 months, were you homeless or living in a shelter (including now)?: No  Utilities: Not on file  Health Literacy: Not on file   Show/hide medication list[1] Allergies[2]  Immunization History  Administered Date(s) Administered   Fluad Trivalent(High Dose 65+) 09/28/2014   Influenza Inj Mdck Quad Pf 08/14/2020   Influenza Split 07/16/2012   Influenza, Mdck, Trivalent,PF 6+ MOS(egg free) 06/26/2024   Influenza,inj,quad, With Preservative 06/10/2018   Influenza-Unspecified 06/28/2021, 06/20/2022   Moderna Covid-19 Fall Seasonal Vaccine 76yrs & older 06/26/2024   Moderna Sars-Covid-2 Vaccination 08/16/2020   PFIZER(Purple Top)SARS-COV-2 Vaccination 12/04/2019, 12/25/2019, 06/20/2022   Tdap 11/05/2021    Health Maintenance  Topic Date Due   Pneumococcal Vaccine (1 of 2 - PCV) Never done   HPV VACCINES (1 - Risk 3-dose SCDM series) Never done   Hepatitis B Vaccines 19-59 Average Risk (1 of 3 - 19+ 3-dose series) 10/31/2024 (Originally 12/18/1997)   Colonoscopy  11/28/2024 (Originally 12/19/2023)   COVID-19 Vaccine (6 - Mixed Product risk 2025-26 season) 12/24/2024   DTaP/Tdap/Td (2 - Td or Tdap)  11/06/2031   Influenza Vaccine  Completed   Hepatitis C Screening  Completed   HIV Screening  Completed   Meningococcal B Vaccine  Aged Out   Mammogram  Discontinued    Patient Care Team: Ancel Easler, Lauraine SAILOR, DO as PCP - General (Family Medicine)  Review of Systems  Constitutional:  Negative for chills, fatigue and fever.  HENT:  Negative for congestion, ear pain, rhinorrhea, sneezing and sore throat.   Eyes: Negative.  Negative for pain and redness.  Respiratory:  Negative for cough, shortness of breath and wheezing.   Cardiovascular:  Negative for chest pain and leg swelling.  Gastrointestinal:  Negative for abdominal pain,  blood in stool, constipation, diarrhea and nausea.  Endocrine: Negative for polydipsia and polyphagia.  Genitourinary: Negative.  Negative for dysuria, flank pain, hematuria, pelvic pain, vaginal bleeding and vaginal discharge.  Musculoskeletal:  Negative for arthralgias, back pain, gait problem and joint swelling.  Skin:  Negative for rash.  Neurological: Negative.  Negative for dizziness, tremors, seizures, weakness, light-headedness, numbness and headaches.  Hematological:  Negative for adenopathy.  Psychiatric/Behavioral: Negative.  Negative for behavioral problems, confusion and dysphoric mood. The patient is not nervous/anxious and is not hyperactive.         Objective    BP 96/73 (BP Location: Right Wrist, Patient Position: Sitting)   Pulse 77   Resp 16   Ht 5' 3 (1.6 m)   Wt 153 lb 11.2 oz (69.7 kg)   SpO2 98%   BMI 27.23 kg/m     Physical Exam Vitals and nursing note reviewed.  Constitutional:      General: She is awake.     Appearance: Normal appearance.  HENT:     Head: Normocephalic and atraumatic.     Right Ear: Tympanic membrane, ear canal and external ear normal.     Left Ear: Tympanic membrane, ear canal and external ear normal.     Nose: Nose normal.     Mouth/Throat:     Mouth: Mucous membranes are moist.     Pharynx:  Oropharynx is clear. No oropharyngeal exudate or posterior oropharyngeal erythema.  Eyes:     General: No scleral icterus.    Extraocular Movements: Extraocular movements intact.     Conjunctiva/sclera: Conjunctivae normal.     Pupils: Pupils are equal, round, and reactive to light.  Neck:     Thyroid: No thyromegaly or thyroid tenderness.  Cardiovascular:     Rate and Rhythm: Normal rate and regular rhythm.     Pulses: Normal pulses.     Heart sounds: Normal heart sounds.  Pulmonary:     Effort: Pulmonary effort is normal. No tachypnea, bradypnea or respiratory distress.     Breath sounds: Normal breath sounds. No stridor. No wheezing, rhonchi or rales.  Chest:  Breasts:    Breasts are symmetrical (S/p double mastectomy).  Abdominal:     General: Bowel sounds are normal. There is no distension.     Palpations: Abdomen is soft. There is no mass.     Tenderness: There is no abdominal tenderness. There is no guarding.     Hernia: No hernia is present.  Musculoskeletal:        General: Swelling (mild, right ankle) present.     Cervical back: Normal range of motion and neck supple.     Right lower leg: No edema.     Left lower leg: No edema.  Lymphadenopathy:     Cervical: No cervical adenopathy.  Skin:    General: Skin is warm and dry.  Neurological:     Mental Status: She is alert and oriented to person, place, and time. Mental status is at baseline.  Psychiatric:        Mood and Affect: Mood normal.        Behavior: Behavior normal.     Depression Screen    09/30/2024    1:50 PM 08/25/2023    9:33 AM 03/31/2023    8:35 AM 10/27/2022    8:52 AM  PHQ 2/9 Scores  PHQ - 2 Score 0 0 0 0  PHQ- 9 Score  6  0  4  Data saved with a previous flowsheet row definition   No results found for any visits on 09/30/24.  Assessment & Plan     Annual physical exam  Transition of care  Moderate persistent asthma without complication -     Pneumococcal conjugate vaccine  20-valent  Mood disorder -     Sertraline  HCl; Take 1 tablet (100 mg total) by mouth daily.  Dispense: 90 tablet; Refill: 3  Essential hypertension -     Microalbumin / creatinine urine ratio -     Comprehensive metabolic panel with GFR  Osteopenia, unspecified location  Mixed hyperlipidemia -     Lipid panel  Nausea -     Ondansetron  HCl; Take 1 tablet (4 mg total) by mouth every 8 (eight) hours as needed for refractory nausea / vomiting.  Dispense: 20 tablet; Refill: 1  Eosinophilic esophagitis -     Ambulatory referral to Gastroenterology  Screen for colon cancer -     Ambulatory referral to Gastroenterology  Immunization due -     Pneumococcal conjugate vaccine 20-valent -     HPV 9-valent vaccine,Recombinat  Hepatitis B vaccination status unknown -     Hepatitis B surface antibody,qualitative     Transition of care; annual physical exam Discussion of vaccinations including Prevnar, hepatitis B, and HPV. Hepatitis B vaccination status uncertain, titers to be checked. HPV vaccination series to be initiated before age 58. - Ordered hepatitis B antibody titer. - Initiated HPV vaccination series.  Eosinophilic esophagitis; screening for colon cancer Requires monitoring of esophageal condition. - Added referral for endoscopy and colonoscopy.  Moderate persistent asthma without complication Asthma is well-managed with montelukast  as needed for seasonal allergies. - Continue montelukast  as needed.  Essential hypertension Chronic, blood pressure generally well-controlled with lisinopril  10 mg daily. Recent episode of elevated blood pressure managed with temporary increase in lisinopril  dosage. - Continue lisinopril  10 mg daily, with option to increase to 20 mg daily as needed for systolic blood pressure >130.  Mixed hyperlipidemia Cholesterol levels are well-controlled with lifestyle modifications. - Ordered regular blood work to monitor cholesterol  levels.  Osteopenia Managed with zoledronic  acid. - Continue zoledronic  acid.  Nausea Intermittent nausea occurring once or twice a month. - Prescribed Zofran .  Mood disorder Well-controlled, managed with sertraline  100 mg daily. No acute concerns.  Continue to monitor.  - Continue sertraline  100 mg daily. - Refilled sertraline  prescription.    Return in about 6 months (around 03/30/2025) for HTN, Chronic f/u w/next provider.     I discussed the assessment and treatment plan with the patient  The patient was provided an opportunity to ask questions and all were answered. The patient agreed with the plan and demonstrated an understanding of the instructions.   The patient was advised to call back or seek an in-person evaluation if the symptoms worsen or if the condition fails to improve as anticipated.    LAURAINE LOISE BUOY, DO  [facility] [phone] (phone) [phone] (fax)  [facility]    [1]  Outpatient Medications Prior to Visit  Medication Sig   letrozole (FEMARA) 2.5 MG tablet Take by mouth.   lisinopril  (ZESTRIL ) 10 MG tablet TAKE 2 TABLETS BY MOUTH EVERY DAY   zoledronic  acid (ZOMETA ) 4 MG/5ML injection Inject into the vein.   [DISCONTINUED] ondansetron  (ZOFRAN ) 4 MG tablet Take 4 mg by mouth every 8 (eight) hours as needed for refractory nausea / vomiting.   [DISCONTINUED] sertraline  (ZOLOFT ) 100 MG tablet Take  1 tablet (100 mg total) by mouth daily.   [DISCONTINUED] Cholecalciferol (VITAMIN D3 PO) Take by mouth. (Patient not taking: Reported on 09/30/2024)   [DISCONTINUED] montelukast  (SINGULAIR ) 10 MG tablet Take 1 tablet (10 mg total) by mouth at bedtime.   No facility-administered medications prior to visit.  [2]  Allergies Allergen Reactions   Codeine Nausea Only    N/V   "

## 2024-10-05 LAB — COMPREHENSIVE METABOLIC PANEL WITH GFR
ALT: 12 IU/L (ref 0–32)
AST: 15 IU/L (ref 0–40)
Albumin: 4.5 g/dL (ref 3.9–4.9)
Alkaline Phosphatase: 83 IU/L (ref 41–116)
BUN/Creatinine Ratio: 20 (ref 9–23)
BUN: 15 mg/dL (ref 6–24)
Bilirubin Total: 0.3 mg/dL (ref 0.0–1.2)
CO2: 22 mmol/L (ref 20–29)
Calcium: 10 mg/dL (ref 8.7–10.2)
Chloride: 104 mmol/L (ref 96–106)
Creatinine, Ser: 0.76 mg/dL (ref 0.57–1.00)
Globulin, Total: 3.2 g/dL (ref 1.5–4.5)
Glucose: 92 mg/dL (ref 70–99)
Potassium: 4.6 mmol/L (ref 3.5–5.2)
Sodium: 140 mmol/L (ref 134–144)
Total Protein: 7.7 g/dL (ref 6.0–8.5)
eGFR: 98 mL/min/1.73

## 2024-10-05 LAB — LIPID PANEL
Chol/HDL Ratio: 3.4 ratio (ref 0.0–4.4)
Cholesterol, Total: 203 mg/dL — ABNORMAL HIGH (ref 100–199)
HDL: 59 mg/dL
LDL Chol Calc (NIH): 123 mg/dL — ABNORMAL HIGH (ref 0–99)
Triglycerides: 120 mg/dL (ref 0–149)
VLDL Cholesterol Cal: 21 mg/dL (ref 5–40)

## 2024-10-05 LAB — MICROALBUMIN / CREATININE URINE RATIO
Creatinine, Urine: 91.2 mg/dL
Microalb/Creat Ratio: 5 mg/g{creat} (ref 0–29)
Microalbumin, Urine: 4.9 ug/mL

## 2024-10-05 LAB — HEPATITIS B SURFACE ANTIBODY,QUALITATIVE: Hep B Surface Ab, Qual: REACTIVE

## 2024-10-26 ENCOUNTER — Ambulatory Visit: Payer: Self-pay | Admitting: Family Medicine

## 2025-03-30 ENCOUNTER — Encounter
